# Patient Record
Sex: Female | Born: 1965 | Hispanic: No | Marital: Single | Smoking: Current every day smoker
Health system: Southern US, Community
[De-identification: ages and names within clinical notes are randomized; demographics above are authoritative.]

## PROBLEM LIST (undated history)

## (undated) DIAGNOSIS — N390 Urinary tract infection, site not specified: Secondary | ICD-10-CM

## (undated) DIAGNOSIS — R51 Headache: Secondary | ICD-10-CM

## (undated) DIAGNOSIS — T7840XA Allergy, unspecified, initial encounter: Secondary | ICD-10-CM

## (undated) DIAGNOSIS — F32A Depression, unspecified: Secondary | ICD-10-CM

## (undated) DIAGNOSIS — R519 Headache, unspecified: Secondary | ICD-10-CM

## (undated) DIAGNOSIS — F329 Major depressive disorder, single episode, unspecified: Secondary | ICD-10-CM

## (undated) DIAGNOSIS — G43909 Migraine, unspecified, not intractable, without status migrainosus: Secondary | ICD-10-CM

## (undated) HISTORY — DX: Migraine, unspecified, not intractable, without status migrainosus: G43.909

## (undated) HISTORY — DX: Allergy, unspecified, initial encounter: T78.40XA

## (undated) HISTORY — PX: COLON SURGERY: SHX602

## (undated) HISTORY — DX: Headache, unspecified: R51.9

## (undated) HISTORY — DX: Headache: R51

---

## 2015-05-08 ENCOUNTER — Encounter (HOSPITAL_COMMUNITY): Payer: Self-pay | Admitting: *Deleted

## 2015-05-08 ENCOUNTER — Emergency Department (INDEPENDENT_AMBULATORY_CARE_PROVIDER_SITE_OTHER)
Admission: EM | Admit: 2015-05-08 | Discharge: 2015-05-08 | Disposition: A | Discharge status: Home / Self Care | Payer: Self-pay | Source: Home / Self Care | Attending: Emergency Medicine | Admitting: Emergency Medicine

## 2015-05-08 ENCOUNTER — Other Ambulatory Visit (HOSPITAL_COMMUNITY)
Admission: RE | Admit: 2015-05-08 | Discharge: 2015-05-08 | Disposition: A | Discharge status: Home / Self Care | Payer: Self-pay | Source: Ambulatory Visit | Attending: Emergency Medicine | Admitting: Emergency Medicine

## 2015-05-08 DIAGNOSIS — N390 Urinary tract infection, site not specified: Secondary | ICD-10-CM | POA: Insufficient documentation

## 2015-05-08 HISTORY — DX: Major depressive disorder, single episode, unspecified: F32.9

## 2015-05-08 HISTORY — DX: Urinary tract infection, site not specified: N39.0

## 2015-05-08 HISTORY — DX: Depression, unspecified: F32.A

## 2015-05-08 LAB — POCT URINALYSIS DIP (DEVICE)
Bilirubin Urine: NEGATIVE
Glucose, UA: 250 mg/dL — AB
Ketones, ur: NEGATIVE mg/dL
Nitrite: POSITIVE — AB
Protein, ur: 30 mg/dL — AB
UROBILINOGEN UA: 2 mg/dL — AB (ref 0.0–1.0)
pH: 5 (ref 5.0–8.0)

## 2015-05-08 MED ORDER — PHENAZOPYRIDINE HCL 200 MG PO TABS: 200.0000 mg | ORAL_TABLET | Freq: Three times a day (TID) | ORAL | Status: DC | PRN

## 2015-05-08 MED ORDER — FLUCONAZOLE 150 MG PO TABS: ORAL_TABLET | ORAL | Status: DC

## 2015-05-08 MED ORDER — NITROFURANTOIN MONOHYD MACRO 100 MG PO CAPS: 100.0000 mg | ORAL_CAPSULE | Freq: Two times a day (BID) | ORAL | Status: DC

## 2015-05-08 NOTE — ED Provider Notes (Signed)
HPI  SUBJECTIVE:  Natalie Drake is a 49 y.o. female who presents with pt with dysuria, urinary urgency, frequency starting yesterday. She reports lower abdominal pain/pressure after urinating.  No aggravating or alleviating factors.  Tried decreasing fluids with improvement.  No nausea, vomiting, fever, chills, back pain.  No hematuria.  No anorexia, other abdominal pain.  No vaginal bleeding or discharge/odor, vulvar itching, genital rash.  Pt denies being sexually active- last contact "years " ago.  STDs are not a concern today. Patient has not taken antipyretic in the past 4-6 hours.  Pt  is not pregnant. Patient was recently treated for a dental infection with amoxicillin. He has a history of frequent UTIs and yeast infections. She states that this does not feel like a yeast infection but like one of her UTIs. Remote history of nephrolithiasis which passed spontaneously. No history of diabetes, immunosuppression. No h/o STD's, BV.  States this feels similar to previous UTIs. Pt is a smoker.    Past Medical History  Diagnosis Date  . UTI (urinary tract infection)   . Depression     History reviewed. No pertinent past surgical history.  No family history on file.  Social History  Substance Use Topics  . Smoking status: Current Every Day Smoker  . Smokeless tobacco: None     Comment: smokes 2 cigarettes per day; trying to quit  . Alcohol Use: No    No current facility-administered medications for this encounter.  Current outpatient prescriptions:  .  sertraline (ZOLOFT) 100 MG tablet, Take 100 mg by mouth daily., Disp: , Rfl:  .  fluconazole (DIFLUCAN) 150 MG tablet, 1 tab po x 1. May repeat in 72 hours if no improvement, Disp: 2 tablet, Rfl: 0 .  nitrofurantoin, macrocrystal-monohydrate, (MACROBID) 100 MG capsule, Take 1 capsule (100 mg total) by mouth 2 (two) times daily. X 5 days, Disp: 10 capsule, Rfl: 0 .  phenazopyridine (PYRIDIUM) 200 MG tablet, Take 1 tablet (200 mg total) by  mouth 3 (three) times daily as needed for pain., Disp: 6 tablet, Rfl: 0  No Known Allergies   ROS  As noted in HPI.   Physical Exam  BP 109/69 mmHg  Pulse 82  Temp(Src) 97.9 F (36.6 C) (Oral)  Resp 16  SpO2 100%  Constitutional: Well developed, well nourished, no acute distress Eyes:  EOMI, conjunctiva normal bilaterally HENT: Normocephalic, atraumatic,mucus membranes moist Respiratory: Normal inspiratory effort Cardiovascular: Normal rate GI: nondistended soft. Normal appearance. Positive suprapubic tenderness. No flank tenderness. Back: No CVA tenderness GU: Deferred skin: No rash, skin intact Musculoskeletal: no deformities Neurologic: Alert & oriented x 3, no focal neuro deficits Psychiatric: Speech and behavior appropriate   ED Course   Medications - No data to display  No orders of the defined types were placed in this encounter.    No results found for this or any previous visit (from the past 24 hour(s)). No results found.  ED Clinical Impression  UTI (lower urinary tract infection)   ED Assessment/Plan  No previous urine cultures available. Reviewed labs. Glucoseuria, large esterase, nitrites, some ketones, some protein. UA consistent with UTI.  No evidence of pyelonephritis or other intra-abdominal process. Doubt GU cause of her symptoms as patient is asymptomatic and denies being sexually active. Deferring pelvic today. Home with Macrobid, Pyridium. Urine culture to confirm antibiotic choice. Patient to give us a working phone number so that we can change antibiotics if necessary. Follow-up with PMD of choice will give primary care referral sheet.  Discussed labs, medical decision-making, signs and symptoms that prompt return to the emergency department. Patient agrees with plan  Discussed labs, imaging, MDM, plan and followup with patient. Discussed sn/sx that should prompt return to the UC or ED. Patient agrees with plan.  *This clinic note was  created using Dragon dictation software. Therefore, there may be occasional mistakes despite careful proofreading.  ?   Domenick Gong, MD 05/08/15 1754

## 2015-05-08 NOTE — ED Notes (Signed)
C/O dysuria, polyuria since yesterday.  Has hx UTIs.  Recently finished amoxicillin for dental work performed.  Has been taking AZO.

## 2015-05-08 NOTE — Discharge Instructions (Signed)
This practice is taking new patients. They will see you even if you do not have insurance.  [facility] [address] [place], [place]  [address] [phone]  Go to www.goodrx.com to look up your medications. This will give you a list of where you can find your prescriptions at the most affordable prices.   If you have no primary doctor, here are some resources that may be helpful:  Medicaid-accepting [facility] Providers: - [facility]- [address]  [phone];   - [facility]- [address] [place], [address] [phone]  - [facility]- [address] [phone] [facility]- [address]  [phone]  - Renaye Rakers- [address] [phone]  Only accepts [place] patients       after they have her name applied to their card  -Dr. Jackie Plum, [facility]. [address]    [place], [place] [address]  [phone]  Self Pay (no insurance) in [place]: - Sickle Cell Patients: Dr Willey Blade, [facility] [address] [place] [phone]  - [facility] [phone]  - Physician Referral Service- [phone]  - [facility]- [address], [place], [phone];  Monday to Friday, 9 a.m. - 7 p.m.; Saturday 9 a.m. to 1 p.m.  [facility]- [address] [place], [place] [phone]  - [facility]- [address]      [phone] - [facility]- [address] [phone]  [facility], [address] [place]; [phone]; or [address] [place]; [phone].   [facility] [place], [place]. [address], [place]; [phone]; Monday to Wednesday, 8:30 a.m. - 5 p.m.; Thursday, 8:30 a.m. - 8 p.m.  [facility], [address], [place];  [phone]; Monday to Friday, 8 a.m. - 4:30 p.m.   [facility], [place] [place] [address] [place], [phone]; first and third Saturday of the month, 9:30 a.m. - 12:30 p.m.  [facility], [address], [place], [phone]; second Saturday of the month, 9 a.m. -noon.  [facility] for children. For information, call [phone]; [phone]; or [phone].  Other agencies that provide inexpensive medical care:     [facility]  [phone]    [facility]  [phone]    [facility]  [phone] [address] [place] [address]    [facility]  [phone]    [facility]  [phone], [phone]; or [phone].  Chronic Pain Problems Contact [facility]  [phone] Patients need to be referred by their primary care doctor.  [facility]  [facility]     [employer]                          [facility]. [address] [place]                       [address]      [address] [place] [address]   [phone] (After Hours)  General Information: Finding a doctor when you do not have health insurance can be tricky. Although you are  not limited by an insurance plan, you are of course limited by her finances and how much but he can pay out of pocket.  What are your options if you don't have health insurance?   1) Find a Librarian, academicDoctor and Pay Out of Pocket Although you won't have to find out who is covered by your insurance plan, it is a good idea to ask around and get recommendations. You will then need to call the office and see if the doctor you have chosen will accept you as a new patient and what types of options they offer for patients who are self-pay. Some doctors offer discounts or will set up payment plans for their patients who do not have insurance, but you will need to ask so you aren't surprised when you get to your appointment.  2) Contact Your  Local Health Department Not all health departments have doctors that can see patients for sick visits, but many do, so it is worth a call to see if yours does. If you don't know where your local health department is, you can check in your phone book. The CDC also has a tool to help you locate your state's health department, and many state websites also have listings of all of their local health departments.  3) Find a Walk-in Clinic If your illness is not likely to be very severe or complicated, you may want to try a walk in clinic. These are popping up all over the country in pharmacies, drugstores, and shopping centers. They're usually staffed by nurse practitioners or physician assistants that have been trained to treat common illnesses and complaints. They're usually fairly quick and inexpensive. However, if you have serious medical issues or chronic medical problems, these are probably not your best option

## 2015-05-10 LAB — URINE CULTURE: Culture: >=100000

## 2015-05-12 NOTE — ED Notes (Signed)
Call from patient , c/o continued UTI symptoms, asking for refill of antibiotic. Discussed w Dr Griffin BasilJD Kindl, who authorize 5 days macrobid 100 mg po, BID. [facility] [address], at patient request. Left detailed message on store VM

## 2017-07-19 ENCOUNTER — Emergency Department (HOSPITAL_BASED_OUTPATIENT_CLINIC_OR_DEPARTMENT_OTHER)
Admission: EM | Admit: 2017-07-19 | Discharge: 2017-07-19 | Disposition: A | Discharge status: Home / Self Care | Payer: 59 | Attending: Emergency Medicine | Admitting: Emergency Medicine

## 2017-07-19 ENCOUNTER — Other Ambulatory Visit: Payer: Self-pay

## 2017-07-19 ENCOUNTER — Encounter (HOSPITAL_BASED_OUTPATIENT_CLINIC_OR_DEPARTMENT_OTHER): Payer: Self-pay | Admitting: Emergency Medicine

## 2017-07-19 ENCOUNTER — Emergency Department (HOSPITAL_BASED_OUTPATIENT_CLINIC_OR_DEPARTMENT_OTHER): Payer: 59

## 2017-07-19 DIAGNOSIS — Z79899 Other long term (current) drug therapy: Secondary | ICD-10-CM | POA: Insufficient documentation

## 2017-07-19 DIAGNOSIS — Y998 Other external cause status: Secondary | ICD-10-CM | POA: Insufficient documentation

## 2017-07-19 DIAGNOSIS — S29019A Strain of muscle and tendon of unspecified wall of thorax, initial encounter: Secondary | ICD-10-CM | POA: Insufficient documentation

## 2017-07-19 DIAGNOSIS — W01198A Fall on same level from slipping, tripping and stumbling with subsequent striking against other object, initial encounter: Secondary | ICD-10-CM | POA: Insufficient documentation

## 2017-07-19 DIAGNOSIS — Y929 Unspecified place or not applicable: Secondary | ICD-10-CM | POA: Diagnosis not present

## 2017-07-19 DIAGNOSIS — Y9389 Activity, other specified: Secondary | ICD-10-CM | POA: Diagnosis not present

## 2017-07-19 DIAGNOSIS — W19XXXA Unspecified fall, initial encounter: Secondary | ICD-10-CM

## 2017-07-19 DIAGNOSIS — S299XXA Unspecified injury of thorax, initial encounter: Secondary | ICD-10-CM | POA: Diagnosis present

## 2017-07-19 DIAGNOSIS — F17210 Nicotine dependence, cigarettes, uncomplicated: Secondary | ICD-10-CM | POA: Insufficient documentation

## 2017-07-19 DIAGNOSIS — S39012A Strain of muscle, fascia and tendon of lower back, initial encounter: Secondary | ICD-10-CM

## 2017-07-19 DIAGNOSIS — F329 Major depressive disorder, single episode, unspecified: Secondary | ICD-10-CM | POA: Insufficient documentation

## 2017-07-19 MED ORDER — CYCLOBENZAPRINE HCL 10 MG PO TABS: 10.0000 mg | ORAL_TABLET | Freq: Three times a day (TID) | ORAL | 0 refills | Status: DC | PRN

## 2017-07-19 MED ORDER — KETOROLAC TROMETHAMINE 15 MG/ML IJ SOLN
15.0000 mg | Freq: Once | INTRAMUSCULAR | Status: AC
  Administered 2017-07-19: 15 mg via INTRAVENOUS
  Filled 2017-07-19: qty 1

## 2017-07-19 MED ORDER — FENTANYL CITRATE (PF) 100 MCG/2ML IJ SOLN
100.0000 ug | Freq: Once | INTRAMUSCULAR | Status: DC
  Filled 2017-07-19: qty 2

## 2017-07-19 MED ORDER — FENTANYL CITRATE (PF) 100 MCG/2ML IJ SOLN
100.0000 ug | Freq: Once | INTRAMUSCULAR | Status: AC
  Administered 2017-07-19: 100 ug via INTRAVENOUS

## 2017-07-19 MED ORDER — KETOROLAC TROMETHAMINE 60 MG/2ML IM SOLN: 60.0000 mg | Freq: Once | INTRAMUSCULAR | Status: DC

## 2017-07-19 NOTE — ED Notes (Signed)
No changes. Updated on wait, plan & process with rationale. Lying flat supine, NAD, calm, resps e/u, no dyspnea.

## 2017-07-19 NOTE — ED Notes (Addendum)
EDP back into room

## 2017-07-19 NOTE — ED Notes (Signed)
Patient transported to X-ray 

## 2017-07-19 NOTE — ED Notes (Addendum)
Alert, NAD, calm, interactive, resps e/u, speaking in clear complete sentences, no dyspnea noted, skin W&D, VSS, slipped and fell, c/o pain across mid upper back below shoulder blades, also tailbone, "feel better now than previously s/p percocet PTA, lying flat supine at this time, "hurts to breathe", describes as sob, (denies: LOC, head or neck pain, NV, numbness, tingling, bleeding, dizziness or visual changes). MAEx4. LS CTA. [facility]

## 2017-07-19 NOTE — ED Notes (Signed)
Lying flat, supine, NAD, calm, interactive, no dyspnea, denies nausea, pain improved, updated, VSS.

## 2017-07-19 NOTE — ED Notes (Signed)
EDP at BS 

## 2017-07-19 NOTE — ED Triage Notes (Signed)
Pt fell down 2 steps today. C/o upper back pain and pelvic pain. Took 2 oxycodone 1 hour PTA.

## 2017-07-19 NOTE — ED Notes (Signed)
EDP into room, immediately called out of room d/t phone call.

## 2017-07-19 NOTE — ED Provider Notes (Signed)
[facility] EMERGENCY DEPARTMENT Provider Note   CSN: [id number] Arrival date & time: 07/19/17  7846     History   Chief Complaint Chief Complaint  Patient presents with  . Fall    HPI Natalie Drake is a 52 y.o. female.  HPI  52 year old female presents with back pain after a fall.  She states that she slipped going down her steps and landed on a step on her thoracic back as well as landing on the ground with her buttocks.  She is having some pain in her buttocks but that has gotten much better and currently has pain in her thoracic back.  It is diffuse across her back.  At first the wind was knocked out of her but now she only has pain in her back when breathing.  She denies any shortness of breath.  She did not hit her head, lose consciousness.  She denies any chest pain, abdominal pain.  She was unable to move due to the pain but has not noticed any weakness or numbness in her extremities.  The buttocks is feeling much better after she took OxyContin at home.  Past Medical History:  Diagnosis Date  . Depression   . UTI (urinary tract infection)     There are no active problems to display for this patient.   Past Surgical History:  Procedure Laterality Date  . COLON SURGERY      OB History    No data available       Home Medications    Prior to Admission medications   Medication Sig Start Date End Date Taking? Authorizing Provider  cyclobenzaprine (FLEXERIL) 10 MG tablet Take 1 tablet (10 mg total) by mouth 3 (three) times daily as needed for muscle spasms. 07/19/17   Pricilla Loveless, MD  fluconazole (DIFLUCAN) 150 MG tablet 1 tab po x 1. May repeat in 72 hours if no improvement 05/08/15   Domenick Gong, MD  nitrofurantoin, macrocrystal-monohydrate, (MACROBID) 100 MG capsule Take 1 capsule (100 mg total) by mouth 2 (two) times daily. X 5 days 05/08/15   Domenick Gong, MD  phenazopyridine (PYRIDIUM) 200 MG tablet Take 1 tablet (200 mg total) by mouth 3  (three) times daily as needed for pain. 05/08/15   Domenick Gong, MD  sertraline (ZOLOFT) 100 MG tablet Take 100 mg by mouth daily.    [provider]    Family History No family history on file.  Social History Social History   Tobacco Use  . Smoking status: Current Every Day Smoker  . Smokeless tobacco: Never Used  . Tobacco comment: smokes 2 cigarettes per day; trying to quit  Substance Use Topics  . Alcohol use: No  . Drug use: No     Allergies   Codeine   Review of Systems Review of Systems  Respiratory: Negative for shortness of breath.   Cardiovascular: Negative for chest pain.  Musculoskeletal: Positive for back pain.  Neurological: Negative for weakness and numbness.  All other systems reviewed and are negative.    Physical Exam Updated Vital Signs BP 106/60   Pulse 79   Temp 97.7 F (36.5 C) (Oral)   Resp 16   SpO2 95%   Physical Exam  Constitutional: She is oriented to person, place, and time. She appears well-developed and well-nourished.  HENT:  Head: Normocephalic and atraumatic.  Right Ear: External ear normal.  Left Ear: External ear normal.  Nose: Nose normal.  Eyes: Right eye exhibits no discharge. Left eye  exhibits no discharge.  Cardiovascular: Normal rate, regular rhythm and normal heart sounds.  Pulmonary/Chest: Effort normal and breath sounds normal.  Abdominal: Soft. She exhibits no distension. There is no tenderness.  Musculoskeletal:       Cervical back: She exhibits no tenderness.       Thoracic back: She exhibits no tenderness.       Lumbar back: She exhibits no tenderness.  I am unable to reproduce patient's back pain in mid-thoracic back, states it is "inside"  Neurological: She is alert and oriented to person, place, and time.  5/5 strength in BLE  Skin: Skin is warm and dry.  Nursing note and vitals reviewed.    ED Treatments / Results  Labs (all labs ordered are listed, but only abnormal results are  displayed) Labs Reviewed - No data to display  EKG  EKG Interpretation None       Radiology Dg Chest 1 View  Result Date: 07/19/2017 CLINICAL DATA:  52 y/o F; fell down 2 steps, mid back pain, worse with deep inhalation. EXAM: THORACIC SPINE - 3 VIEWS; CHEST  1 VIEW COMPARISON:  None. FINDINGS: Thoracic spine: There is no evidence of thoracic spine fracture. Alignment is normal. No other significant bone abnormalities are identified. Chest one view: Normal cardiac silhouette. Clear lungs. No pleural effusion or pneumothorax. No acute osseous abnormality is evident. IMPRESSION: 1. No acute fracture or dislocation identified. 2. No acute pulmonary process on single-view chest radiograph. Electronically Signed   By: Mitzi HansenLance  Furusawa-Stratton M.D.   On: 07/19/2017 22:13   Dg Thoracic Spine W/swimmers  Result Date: 07/19/2017 CLINICAL DATA:  52 y/o F; fell down 2 steps, mid back pain, worse with deep inhalation. EXAM: THORACIC SPINE - 3 VIEWS; CHEST  1 VIEW COMPARISON:  None. FINDINGS: Thoracic spine: There is no evidence of thoracic spine fracture. Alignment is normal. No other significant bone abnormalities are identified. Chest one view: Normal cardiac silhouette. Clear lungs. No pleural effusion or pneumothorax. No acute osseous abnormality is evident. IMPRESSION: 1. No acute fracture or dislocation identified. 2. No acute pulmonary process on single-view chest radiograph. Electronically Signed   By: Mitzi HansenLance  Furusawa-Stratton M.D.   On: 07/19/2017 22:13    Procedures Procedures (including critical care time)  Medications Ordered in ED Medications  ketorolac (TORADOL) 15 MG/ML injection 15 mg (15 mg Intravenous Given 07/19/17 2136)  fentaNYL (SUBLIMAZE) injection 100 mcg (100 mcg Intravenous Given 07/19/17 2142)     Initial Impression / Assessment and Plan / ED Course  I have reviewed the triage vital signs and the nursing notes.  Pertinent labs & imaging results that were available during my  care of the patient were reviewed by me and considered in my medical decision making (see chart for details).     Patient is neurologically intact.  While she does have pain, she does not have any bony injury seen.  Highly doubt acute ligamentous injury or neuro emergency.  Otherwise exam is benign.  She will be encouraged to use NSAIDs, Tylenol, and will be given muscle relaxers.  Otherwise, follow-up with PCP.  Return precautions.  Final Clinical Impressions(s) / ED Diagnoses   Final diagnoses:  Fall, initial encounter  Back strain, initial encounter    ED Discharge Orders        Ordered    cyclobenzaprine (FLEXERIL) 10 MG tablet  3 times daily PRN     07/19/17 2256       Pricilla LovelessGoldston, Terryl Molinelli, MD 07/19/17 2312

## 2017-11-05 ENCOUNTER — Ambulatory Visit: Payer: 59 | Admitting: Family Medicine

## 2017-11-05 ENCOUNTER — Encounter: Payer: Self-pay | Admitting: Family Medicine

## 2017-11-05 VITALS — BP 100/70 | HR 82 | Temp 98.6°F | Ht 63.0 in | Wt 128.8 lb

## 2017-11-05 DIAGNOSIS — F339 Major depressive disorder, recurrent, unspecified: Secondary | ICD-10-CM

## 2017-11-05 DIAGNOSIS — Z0000 Encounter for general adult medical examination without abnormal findings: Secondary | ICD-10-CM | POA: Insufficient documentation

## 2017-11-05 DIAGNOSIS — Z0001 Encounter for general adult medical examination with abnormal findings: Secondary | ICD-10-CM | POA: Diagnosis not present

## 2017-11-05 MED ORDER — SERTRALINE HCL 100 MG PO TABS
100.0000 mg | ORAL_TABLET | Freq: Every day | ORAL | 3 refills | Status: DC
Start: 2017-11-05 — End: 2018-11-23

## 2017-11-05 NOTE — Progress Notes (Addendum)
Subjective:  Patient ID: Natalie Drake, female    DOB: 01-04-1966  Age: 52 y.o. MRN: [id number]  CC: Establish Care (est care/ refill on zoloft/migrains/stress and panic attack--2 times a mo)   HPI Natalie Drake presents for a physical exam and follow-up of her depression and anxiety.  Patient's depression symptoms have been controlled with the Zoloft.  This is been a long-term indication for her.  Her life remains stressful between work and home.  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  Work is stressful because so much seems to depend on her.  She fell a few months ago and landed on her upper back.  She was seen at urgent care and no fractures were seen but her upper back remains tender especially with a deep breath.  It is improving and has responded to ibuprofen.  She is due for a mammogram and Pap smear.  Outpatient Medications Prior to Visit  Medication Sig Dispense Refill  . Aspirin-Acetaminophen-Caffeine (EXCEDRIN PO) Take by mouth.    . diphenhydrAMINE HCl (BENADRYL ALLERGY PO) Take by mouth.    . sertraline (ZOLOFT) 100 MG tablet Take 100 mg by mouth daily.    . cyclobenzaprine (FLEXERIL) 10 MG tablet Take 1 tablet (10 mg total) by mouth 3 (three) times daily as needed for muscle spasms. (Patient not taking: Reported on 11/05/2017) 20 tablet 0  . fluconazole (DIFLUCAN) 150 MG tablet 1 tab po x 1. May repeat in 72 hours if no improvement (Patient not taking: Reported on 11/05/2017) 2 tablet 0  . nitrofurantoin, macrocrystal-monohydrate, (MACROBID) 100 MG capsule Take 1 capsule (100 mg total) by mouth 2 (two) times daily. X 5 days (Patient not taking: Reported on 11/05/2017) 10 capsule 0  . phenazopyridine (PYRIDIUM) 200 MG tablet Take 1 tablet (200 mg total) by mouth 3 (three) times daily as needed for pain. (Patient not taking: Reported on 11/05/2017) 6 tablet 0   No facility-administered medications prior to visit.     ROS Review of Systems    Constitutional: Negative for chills, fatigue, fever and unexpected weight change.  HENT: Negative.   Eyes: Negative.   Respiratory: Negative.   Cardiovascular: Negative.   Gastrointestinal: Negative.   Endocrine: Negative for polyphagia and polyuria.  Genitourinary: Negative for difficulty urinating and hematuria.  Musculoskeletal: Negative for arthralgias and myalgias.  Skin: Negative for pallor and rash.  Allergic/Immunologic: Negative for immunocompromised state.  Neurological: Negative for weakness and headaches.  Hematological: Does not bruise/bleed easily.  Psychiatric/Behavioral: Positive for dysphoric mood. Negative for behavioral problems and confusion. The patient is nervous/anxious.     Objective:  BP 100/70   Pulse 82   Temp 98.6 F (37 C) (Oral)   Ht 5\' 3"  (1.6 m)   Wt 128 lb 12.8 oz (58.4 kg)   SpO2 96%   BMI 22.82 kg/m   BP Readings from Last 3 Encounters:  11/05/17 100/70  07/19/17 106/60  05/08/15 109/69    Wt Readings from Last 3 Encounters:  11/05/17 128 lb 12.8 oz (58.4 kg)    Physical Exam  Constitutional: She is oriented to person, place, and time. She appears well-developed and well-nourished. No distress.  HENT:  Head: Normocephalic and atraumatic.  Right Ear: External ear normal.  Left Ear: External ear normal.  Mouth/Throat: Oropharynx is clear and moist. No oropharyngeal exudate.  Eyes: Pupils are equal, round, and reactive to light. Conjunctivae and EOM are normal. Right eye exhibits no discharge. Left  eye exhibits no discharge. No scleral icterus.  Neck: Normal range of motion. Neck supple. No JVD present. No tracheal deviation present. No thyromegaly present.  Cardiovascular: Normal rate, regular rhythm and normal heart sounds.  Pulmonary/Chest: Effort normal and breath sounds normal.  Abdominal: Soft. Bowel sounds are normal.  Musculoskeletal: She exhibits no edema or deformity.  Lymphadenopathy:    She has no cervical adenopathy.   Neurological: She is alert and oriented to person, place, and time.  Skin: Skin is warm and dry. Capillary refill takes less than 2 seconds. She is not diaphoretic.  Psychiatric: She has a normal mood and affect. Her behavior is normal.   [facility] 11/05/2017  Decreased Interest 1  Down, Depressed, Hopeless 2  PHQ - 2 Score 3  Altered sleeping 1  Tired, decreased energy 3  Change in appetite 0  Feeling bad or failure about yourself  1  Trouble concentrating 2  Moving slowly or fidgety/restless 0  Suicidal thoughts 0  PHQ-9 Score 10    No results found for: WBC, HGB, HCT, PLT, GLUCOSE, CHOL, TRIG, HDL, LDLDIRECT, LDLCALC, ALT, AST, NA, K, CL, CREATININE, BUN, CO2, TSH, PSA, INR, GLUF, HGBA1C, MICROALBUR  Dg Chest 1 View  Result Date: 07/19/2017 CLINICAL DATA:  52 y/o F; fell down 2 steps, mid back pain, worse with deep inhalation. EXAM: THORACIC SPINE - 3 VIEWS; CHEST  1 VIEW COMPARISON:  None. FINDINGS: Thoracic spine: There is no evidence of thoracic spine fracture. Alignment is normal. No other significant bone abnormalities are identified. Chest one view: Normal cardiac silhouette. Clear lungs. No pleural effusion or pneumothorax. No acute osseous abnormality is evident. IMPRESSION: 1. No acute fracture or dislocation identified. 2. No acute pulmonary process on single-view chest radiograph. Electronically Signed   By: Mitzi HansenLance  Furusawa-Stratton M.D.   On: 07/19/2017 22:13   Dg Thoracic Spine W/swimmers  Result Date: 07/19/2017 CLINICAL DATA:  52 y/o F; fell down 2 steps, mid back pain, worse with deep inhalation. EXAM: THORACIC SPINE - 3 VIEWS; CHEST  1 VIEW COMPARISON:  None. FINDINGS: Thoracic spine: There is no evidence of thoracic spine fracture. Alignment is normal. No other significant bone abnormalities are identified. Chest one view: Normal cardiac silhouette. Clear lungs. No pleural effusion or pneumothorax. No acute osseous abnormality is evident. IMPRESSION: 1.  No acute fracture or dislocation identified. 2. No acute pulmonary process on single-view chest radiograph. Electronically Signed   By: Mitzi HansenLance  Furusawa-Stratton M.D.   On: 07/19/2017 22:13    Assessment & Plan:   Natalie Drake was seen today for establish care.  Diagnoses and all orders for this visit:  Encounter for health maintenance examination with abnormal findings -     CBC; Future -     Comprehensive metabolic panel; Future -     Lipid panel; Future -     Urinalysis, Routine w reflex microscopic; Future -     HIV antibody; Future  Depression, recurrent (HCC) -     sertraline (ZOLOFT) 100 MG tablet; Take 1 tablet (100 mg total) by mouth daily.   I have changed Natalie Drake's sertraline. I am also having her maintain her phenazopyridine, nitrofurantoin (macrocrystal-monohydrate), fluconazole, cyclobenzaprine, diphenhydrAMINE HCl (BENADRYL ALLERGY PO), and Aspirin-Acetaminophen-Caffeine (EXCEDRIN PO).  Meds ordered this encounter  Medications  . sertraline (ZOLOFT) 100 MG tablet    Sig: Take 1 tablet (100 mg total) by mouth daily.    Dispense:  100 tablet    Refill:  3   She will return  fasting for blood work.  She will schedule her mammogram and follow-up at some point for Pap smear.  We discussed the possibility of pursuing counseling.  She will let me know if this is something she would like to pursue.  Follow-up: No follow-ups on file.  Mliss Sax, MD

## 2017-11-07 ENCOUNTER — Encounter: Payer: Self-pay | Admitting: Family Medicine

## 2018-09-09 ENCOUNTER — Telehealth: Payer: Self-pay | Admitting: Family Medicine

## 2018-09-09 NOTE — Telephone Encounter (Signed)
Called pt on behalf of Dr Doreene Burke because pt was last seen 11/05/17 and orders were put in for her to do fasting labs but she never scheduled. I called to see if she would still like to schedule, left message.

## 2018-11-19 ENCOUNTER — Ambulatory Visit: Payer: 59 | Admitting: Family Medicine

## 2018-11-23 ENCOUNTER — Encounter: Payer: Self-pay | Admitting: Family Medicine

## 2018-11-23 ENCOUNTER — Ambulatory Visit: Payer: 59 | Admitting: Family Medicine

## 2018-11-23 VITALS — BP 120/70 | HR 102 | Ht 63.0 in | Wt 136.2 lb

## 2018-11-23 DIAGNOSIS — Z720 Tobacco use: Secondary | ICD-10-CM | POA: Diagnosis not present

## 2018-11-23 DIAGNOSIS — Z0000 Encounter for general adult medical examination without abnormal findings: Secondary | ICD-10-CM

## 2018-11-23 DIAGNOSIS — F339 Major depressive disorder, recurrent, unspecified: Secondary | ICD-10-CM

## 2018-11-23 MED ORDER — SERTRALINE HCL 100 MG PO TABS: 100.0000 mg | ORAL_TABLET | Freq: Every day | ORAL | 3 refills | Status: DC

## 2018-11-23 NOTE — Progress Notes (Signed)
Established Patient Office Visit  Subjective:  Patient ID: Natalie Drake, female    DOB: 06-02-1965  Age: 53 y.o. MRN: [id number]  CC:  Chief Complaint  Patient presents with  . Follow-up    HPI Natalie Drake presents for routine follow-up of her depression.  Is done well over the years with Zoloft.  Has occasional anxiety.  Continues to work at [employer].  She is busy at work and walks a great deal.  Has no regular exercise.  Has been sheltering at home.  Smokes about 10 cigarettes a day.  She does not drink alcohol or use illicit drugs.  Normal Pap smear this past year.  Past Medical History:  Diagnosis Date  . Allergy   . Depression   . Frequent headaches   . Migraines   . UTI (urinary tract infection)     Past Surgical History:  Procedure Laterality Date  . COLON SURGERY      Family History  Problem Relation Age of Onset  . Hearing loss Mother   . Hypertension Mother     Social History   Socioeconomic History  . Marital status: Single    Spouse name: Not on file  . Number of children: Not on file  . Years of education: Not on file  . Highest education level: Not on file  Occupational History  . Not on file  Social Needs  . Financial resource strain: Not on file  . Food insecurity    Worry: Not on file    Inability: Not on file  . Transportation needs    Medical: Not on file    Non-medical: Not on file  Tobacco Use  . Smoking status: Current Every Day Smoker    Packs/day: 0.50    Types: Cigarettes  . Smokeless tobacco: Never Used  . Tobacco comment: smokes 2 cigarettes per day; trying to quit  Substance and Sexual Activity  . Alcohol use: No  . Drug use: No  . Sexual activity: Not on file  Lifestyle  . Physical activity    Days per week: Not on file    Minutes per session: Not on file  . Stress: Not on file  Relationships  . Social Herbalist on phone: Not on file    Gets together: Not on file    Attends religious service:  Not on file    Active member of club or organization: Not on file    Attends meetings of clubs or organizations: Not on file    Relationship status: Not on file  . Intimate partner violence    Fear of current or ex partner: Not on file    Emotionally abused: Not on file    Physically abused: Not on file    Forced sexual activity: Not on file  Other Topics Concern  . Not on file  Social History Narrative  . Not on file    Outpatient Medications Prior to Visit  Medication Sig Dispense Refill  . Aspirin-Acetaminophen-Caffeine (EXCEDRIN PO) Take by mouth.    . diphenhydrAMINE HCl (BENADRYL ALLERGY PO) Take by mouth.    . sertraline (ZOLOFT) 100 MG tablet Take 1 tablet (100 mg total) by mouth daily. 100 tablet 3  . cyclobenzaprine (FLEXERIL) 10 MG tablet Take 1 tablet (10 mg total) by mouth 3 (three) times daily as needed for muscle spasms. (Patient not taking: Reported on 11/05/2017) 20 tablet 0  . fluconazole (DIFLUCAN) 150 MG tablet 1 tab po x  1. May repeat in 72 hours if no improvement (Patient not taking: Reported on 11/05/2017) 2 tablet 0  . nitrofurantoin, macrocrystal-monohydrate, (MACROBID) 100 MG capsule Take 1 capsule (100 mg total) by mouth 2 (two) times daily. X 5 days (Patient not taking: Reported on 11/05/2017) 10 capsule 0  . phenazopyridine (PYRIDIUM) 200 MG tablet Take 1 tablet (200 mg total) by mouth 3 (three) times daily as needed for pain. (Patient not taking: Reported on 11/05/2017) 6 tablet 0   No facility-administered medications prior to visit.     Allergies  Allergen Reactions  . Codeine Itching    High dose  . Sulfa Antibiotics Nausea And Vomiting    ROS Review of Systems  Constitutional: Negative for chills, diaphoresis, fatigue, fever and unexpected weight change.  HENT: Negative.   Eyes: Negative for photophobia and visual disturbance.  Respiratory: Negative.   Cardiovascular: Negative.   Endocrine: Negative for polyphagia and polyuria.  Genitourinary:  Negative for dysuria, frequency and urgency.  Musculoskeletal: Negative for arthralgias and myalgias.  Skin: Negative for pallor and rash.  Allergic/Immunologic: Negative for immunocompromised state.  Neurological: Negative for light-headedness and numbness.  Hematological: Negative.    Depression screen [facility] 2/9 11/23/2018 11/05/2017  Decreased Interest 1 1  Down, Depressed, Hopeless 1 2  PHQ - 2 Score 2 3  Altered sleeping 0 1  Tired, decreased energy 0 3  Change in appetite 0 0  Feeling bad or failure about yourself  0 1  Trouble concentrating 1 2  Moving slowly or fidgety/restless 0 0  Suicidal thoughts 0 0  PHQ-9 Score 3 10      Objective:    Physical Exam  Constitutional: She is oriented to person, place, and time. She appears well-developed and well-nourished. No distress.  HENT:  Head: Normocephalic and atraumatic.  Right Ear: External ear normal.  Left Ear: External ear normal.  Mouth/Throat: Oropharynx is clear and moist. No oropharyngeal exudate.  Eyes: Pupils are equal, round, and reactive to light. Conjunctivae are normal. Right eye exhibits no discharge. Left eye exhibits no discharge. No scleral icterus.  Neck: Neck supple. No JVD present. No tracheal deviation present. No thyromegaly present.  Cardiovascular: Normal rate, regular rhythm and normal heart sounds.  Pulmonary/Chest: Effort normal and breath sounds normal. No stridor.  Abdominal: Bowel sounds are normal.  Lymphadenopathy:    She has no cervical adenopathy.  Neurological: She is alert and oriented to person, place, and time.  Skin: Skin is warm and dry. She is not diaphoretic.  Psychiatric: She has a normal mood and affect. Her behavior is normal.    BP 120/70   Pulse (!) 102   Ht 5' 3" (1.6 m)   Wt 136 lb 4 oz (61.8 kg)   SpO2 98%   BMI 24.14 kg/m  Wt Readings from Last 3 Encounters:  11/23/18 136 lb 4 oz (61.8 kg)  11/05/17 128 lb 12.8 oz (58.4 kg)   BP Readings from Last 3 Encounters:   11/23/18 120/70  11/05/17 100/70  07/19/17 106/60   Guideline developer:  UpToDate (see UpToDate for funding source) Date Released: June 2014  Health Maintenance Due  Topic Date Due  . Hepatitis C Screening  1966/05/03  . HIV Screening  08/01/1978  . TETANUS/TDAP  08/01/1982    There are no preventive care reminders to display for this patient.  No results found for: TSH No results found for: WBC, HGB, HCT, MCV, PLT No results found for: NA, K, CHLORIDE, CO2, GLUCOSE, BUN,  CREATININE, BILITOT, ALKPHOS, AST, ALT, PROT, ALBUMIN, CALCIUM, ANIONGAP, EGFR, GFR No results found for: CHOL No results found for: HDL No results found for: LDLCALC No results found for: TRIG No results found for: CHOLHDL No results found for: HGBA1C    Assessment & Plan:   Problem List Items Addressed This Visit      Other   Healthcare maintenance   Relevant Orders   CBC   Comprehensive metabolic panel   Lipid panel   TSH   Urinalysis, Routine w reflex microscopic   Depression, recurrent (HCC) - Primary   Relevant Medications   sertraline (ZOLOFT) 100 MG tablet   Tobacco use      Meds ordered this encounter  Medications  . sertraline (ZOLOFT) 100 MG tablet    Sig: Take 1 tablet (100 mg total) by mouth daily.    Dispense:  100 tablet    Refill:  3    Follow-up: Return in about 6 months (around 05/26/2019).    Patient was given information on quitting smoking and advised to do so.  She was also given information on living with depression.  She will follow-up at her convenience for above ordered blood work.

## 2018-11-23 NOTE — Patient Instructions (Signed)
Health Risks of Smoking Smoking cigarettes is very bad for your health. Tobacco smoke has over 200 known poisons in it. It contains the poisonous gases nitrogen oxide and carbon monoxide. There are over 60 chemicals in tobacco smoke that cause cancer. Smoking is difficult to quit because a chemical in tobacco, called nicotine, causes addiction or dependence. When you smoke and inhale, nicotine is absorbed rapidly into the bloodstream through your lungs. Both inhaled and non-inhaled nicotine may be addictive. What are the risks of cigarette smoke? Cigarette smokers have an increased risk of many serious medical problems, including:  Lung cancer.  Lung disease, such as pneumonia, bronchitis, and emphysema.  Chest pain (angina) and heart attack because the heart is not getting enough oxygen.  Heart disease and peripheral blood vessel disease.  High blood pressure (hypertension).  Stroke.  Oral cancer, including cancer of the lip, mouth, or voice box.  Bladder cancer.  Pancreatic cancer.  Cervical cancer.  Pregnancy complications, including premature birth.  Stillbirths and smaller newborn babies, birth defects, and genetic damage to sperm.  Early menopause.  Lower estrogen level for women.  Infertility.  Facial wrinkles.  Blindness.  Increased risk of broken bones (fractures).  Senile dementia.  Stomach ulcers and internal bleeding.  Delayed wound healing and increased risk of complications during surgery.  Even smoking lightly shortens your life expectancy by several years. Because of secondhand smoke exposure, children of smokers have an increased risk of the following:  Sudden infant death syndrome (SIDS).  Respiratory infections.  Lung cancer.  Heart disease.  Ear infections. What are the benefits of quitting? There are many health benefits of quitting smoking. Here are some of them:  Within days of quitting smoking, your risk of having a heart attack  decreases, your blood flow improves, and your lung capacity improves. Blood pressure, pulse rate, and breathing patterns start returning to normal soon after quitting.  Within months, your lungs may clear up completely.  Quitting for 10 years reduces your risk of developing lung cancer and heart disease to almost that of a nonsmoker.  People who quit may see an improvement in their overall quality of life. How do I quit smoking?     Smoking is an addiction with both physical and psychological effects, and longtime habits can be hard to change. Your health care provider can recommend:  Programs and community resources, which may include group support, education, or talk therapy.  Prescription medicines to help reduce cravings.  Nicotine replacement products, such as patches, gum, and nasal sprays. Use these products only as directed. Do not replace cigarette smoking with electronic cigarettes, which are commonly called e-cigarettes. The safety of e-cigarettes is not known, and some may contain harmful chemicals.  A combination of two or more of these methods. Where to find more information  American Lung Association: www.lung.org  American Cancer Society: www.cancer.org Summary  Smoking cigarettes is very bad for your health. Cigarette smokers have an increased risk of many serious medical problems, including several cancers, heart disease, and stroke.  Smoking is an addiction with both physical and psychological effects, and longtime habits can be hard to change.  By stopping right away, you can greatly reduce the risk of medical problems for you and your family.  To help you quit smoking, your health care provider can recommend programs, community resources, prescription medicines, and nicotine replacement products such as patches, gum, and nasal sprays. This information is not intended to replace advice given to you by your health  care provider. Make sure you discuss any questions  you have with your health care provider. Document Released: 06/06/2004 Document Revised: 07/31/2017 Document Reviewed: 05/03/2016 Elsevier Patient Education  2020 [employer] Inc[employer]  Living With Depression Everyone experiences occasional disappointment, sadness, and loss in their lives. When you are feeling down, blue, or sad for at least 2 weeks in a row, it may mean that you have depression. Depression can affect your thoughts and feelings, relationships, daily activities, and physical health. It is caused by changes in the way your brain functions. If you receive a diagnosis of depression, your health care provider will tell you which type of depression you have and what treatment options are available to you. If you are living with depression, there are ways to help you recover from it and also ways to prevent it from coming back. How to cope with lifestyle changes Coping with stress     Stress is your body's reaction to life changes and events, both good and bad. Stressful situations may include:  Getting married.  The death of a spouse.  Losing a job.  Retiring.  Having a baby. Stress can last just a few hours or it can be ongoing. Stress can play a major role in depression, so it is important to learn both how to cope with stress and how to think about it differently. Talk with your health care provider or a counselor if you would like to learn more about stress reduction. He or she may suggest some stress reduction techniques, such as:  Music therapy. This can include creating music or listening to music. Choose music that you enjoy and that inspires you.  Mindfulness-based meditation. This kind of meditation can be done while sitting or walking. It involves being aware of your normal breaths, rather than trying to control your breathing.  Centering prayer. This is a kind of meditation that involves focusing on a spiritual word or phrase. Choose a word, phrase, or sacred image that  is meaningful to you and that brings you peace.  Deep breathing. To do this, expand your stomach and inhale slowly through your nose. Hold your breath for 3-5 seconds, then exhale slowly, allowing your stomach muscles to relax.  Muscle relaxation. This involves intentionally tensing muscles then relaxing them. Choose a stress reduction technique that fits your lifestyle and personality. Stress reduction techniques take time and practice to develop. Set aside 5-15 minutes a day to do them. Therapists can offer training in these techniques. The training may be covered by some insurance plans. Other things you can do to manage stress include:  Keeping a stress diary. This can help you learn what triggers your stress and ways to control your response.  Understanding what your limits are and saying no to requests or events that lead to a schedule that is too full.  Thinking about how you respond to certain situations. You may not be able to control everything, but you can control how you react.  Adding humor to your life by watching funny films or TV shows.  Making time for activities that help you relax and not feeling guilty about spending your time this way.  Medicines Your health care provider may suggest certain medicines if he or she feels that they will help improve your condition. Avoid using alcohol and other substances that may prevent your medicines from working properly (may interact). It is also important to:  Talk with your pharmacist or health care provider about all the medicines that  you take, their possible side effects, and what medicines are safe to take together.  Make it your goal to take part in all treatment decisions (shared decision-making). This includes giving input on the side effects of medicines. It is best if shared decision-making with your health care provider is part of your total treatment plan. If your health care provider prescribes a medicine, you may not  notice the full benefits of it for 4-8 weeks. Most people who are treated for depression need to be on medicine for at least 6-12 months after they feel better. If you are taking medicines as part of your treatment, do not stop taking medicines without first talking to your health care provider. You may need to have the medicine slowly decreased (tapered) over time to decrease the risk of harmful side effects. Relationships Your health care provider may suggest family therapy along with individual therapy and drug therapy. While there may not be family problems that are causing you to feel depressed, it is still important to make sure your family learns as much as they can about your mental health. Having your family's support can help make your treatment successful. How to recognize changes in your condition Everyone has a different response to treatment for depression. Recovery from major depression happens when you have not had signs of major depression for two months. This may mean that you will start to:  Have more interest in doing activities.  Feel less hopeless than you did 2 months ago.  Have more energy.  Overeat less often, or have better or improving appetite.  Have better concentration. Your health care provider will work with you to decide the next steps in your recovery. It is also important to recognize when your condition is getting worse. Watch for these signs:  Having fatigue or low energy.  Eating too much or too little.  Sleeping too much or too little.  Feeling restless, agitated, or hopeless.  Having trouble concentrating or making decisions.  Having unexplained physical complaints.  Feeling irritable, angry, or aggressive. Get help as soon as you or your family members notice these symptoms coming back. How to get support and help from others How to talk with friends and family members about your condition  Talking to friends and family members about your  condition can provide you with one way to get support and guidance. Reach out to trusted friends or family members, explain your symptoms to them, and let them know that you are working with a health care provider to treat your depression. Financial resources Not all insurance plans cover mental health care, so it is important to check with your insurance carrier. If paying for co-pays or counseling services is a problem, search for a local or county mental health care center. They may be able to offer public mental health care services at low or no cost when you are not able to see a private health care provider. If you are taking medicine for depression, you may be able to get the generic form, which may be less expensive. Some makers of prescription medicines also offer help to patients who cannot afford the medicines they need. Follow these instructions at home:   Get the right amount and quality of sleep.  Cut down on using caffeine, tobacco, alcohol, and other potentially harmful substances.  Try to exercise, such as walking or lifting small weights.  Take over-the-counter and prescription medicines only as told by your health care provider.  Eat a  healthy diet that includes plenty of vegetables, fruits, whole grains, low-fat dairy products, and lean protein. Do not eat a lot of foods that are high in solid fats, added sugars, or salt.  Keep all follow-up visits as told by your health care provider. This is important. Contact a health care provider if:  You stop taking your antidepressant medicines, and you have any of these symptoms: ? Nausea. ? Headache. ? Feeling lightheaded. ? Chills and body aches. ? Not being able to sleep (insomnia).  You or your friends and family think your depression is getting worse. Get help right away if:  You have thoughts of hurting yourself or others. If you ever feel like you may hurt yourself or others, or have thoughts about taking your own  life, get help right away. You can go to your nearest emergency department or call:  Your local emergency services (911 in the [place]  A suicide crisis helpline, such as the National Suicide Prevention Lifeline at 312-594-79251-941-143-9303. This is open 24-hours a day. Summary  If you are living with depression, there are ways to help you recover from it and also ways to prevent it from coming back.  Work with your health care team to create a management plan that includes counseling, stress management techniques, and healthy lifestyle habits. This information is not intended to replace advice given to you by your health care provider. Make sure you discuss any questions you have with your health care provider. Document Released: 04/01/2016 Document Revised: 08/21/2018 Document Reviewed: 04/01/2016 Elsevier Patient Education  2020 [employer]

## 2019-07-27 IMAGING — CR DG CHEST 1V
1 series · 1 of 1 positions shown · non-contrast
Comparison: None.

CLINICAL DATA: 51 y/o F; fell down 2 steps, mid back pain, worse
with deep inhalation.

EXAM:
THORACIC SPINE - 3 VIEWS; CHEST  1 VIEW

[t chest supine]
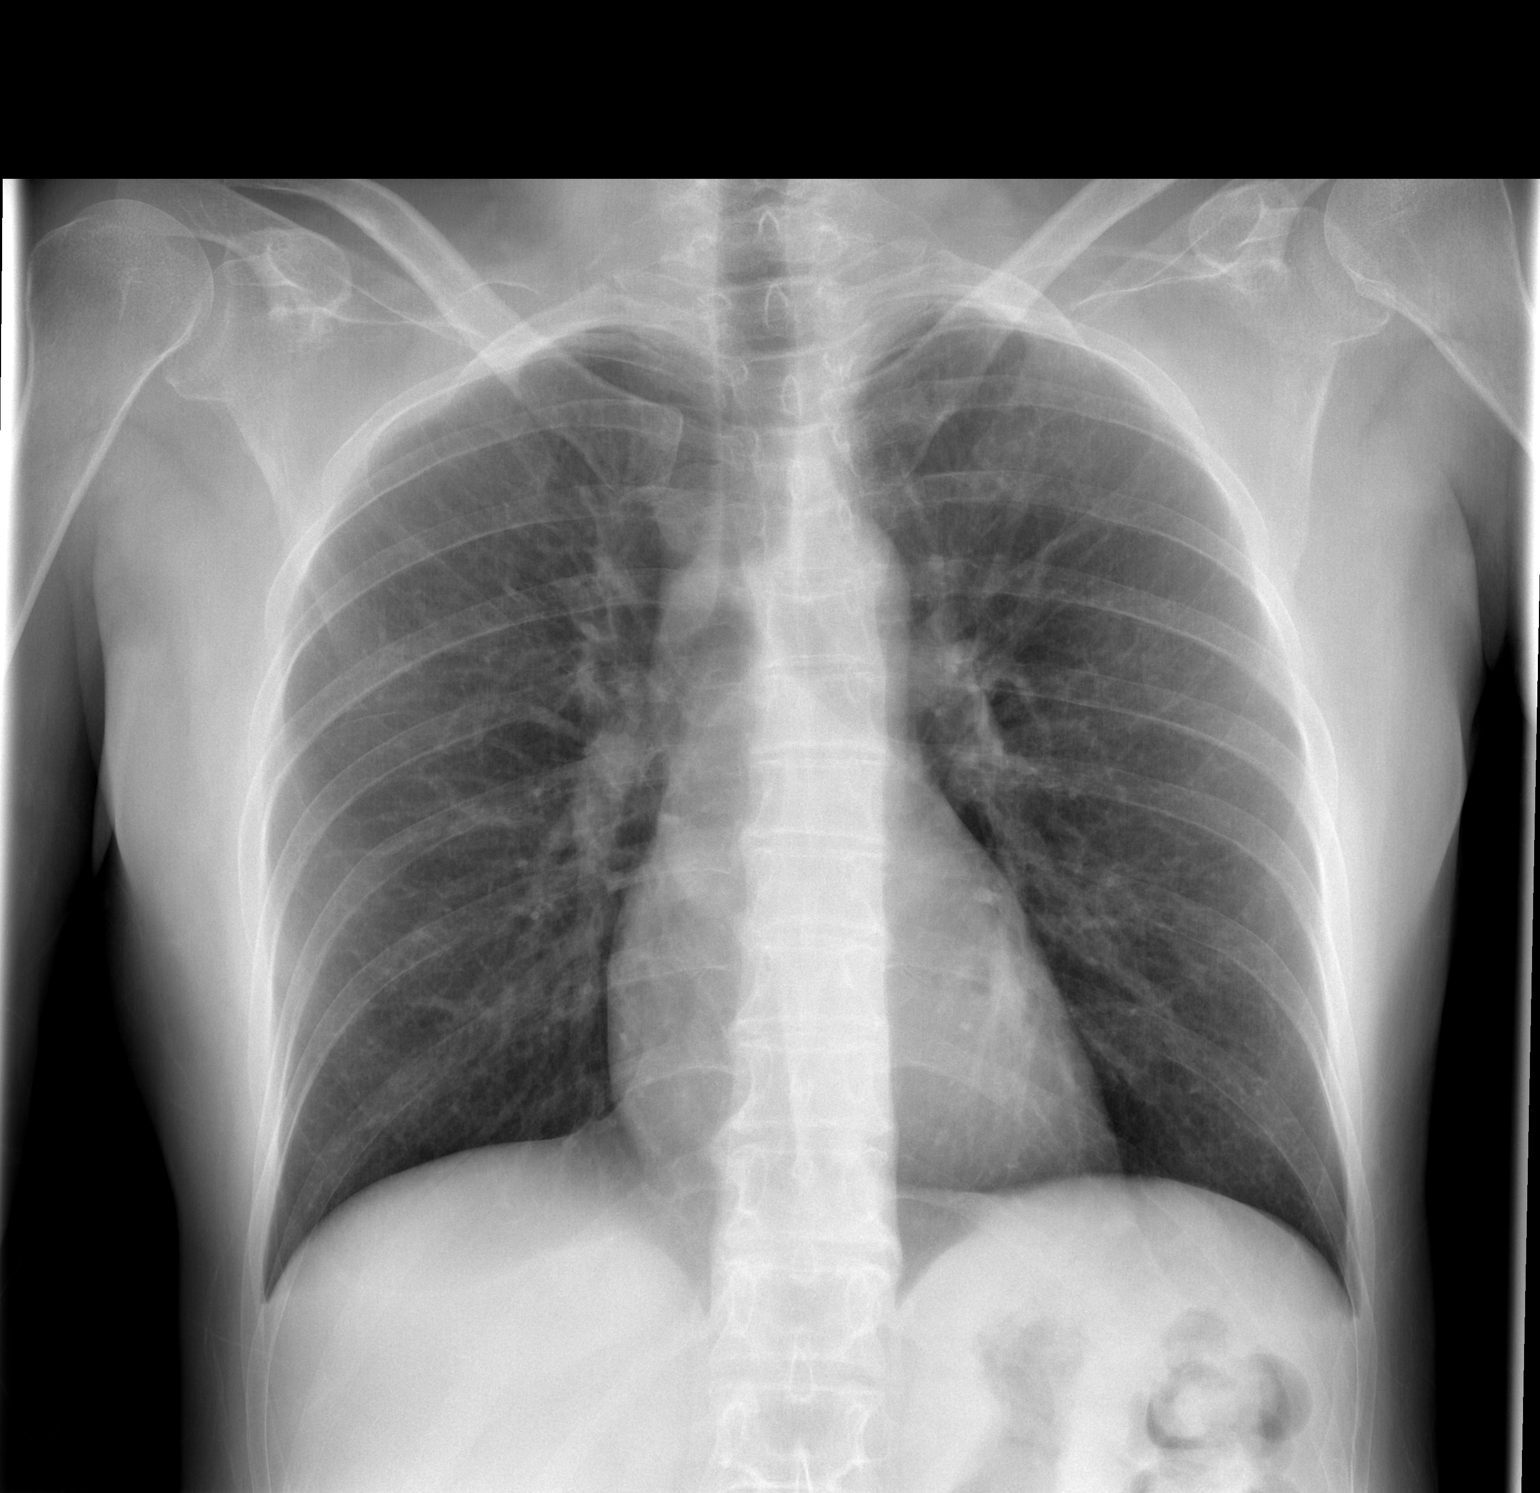

[1 of 1 positions shown; findings below may reference images not displayed]

FINDINGS: Thoracic spine:

There is no evidence of thoracic spine fracture. Alignment is
normal. No other significant bone abnormalities are identified.

Chest one view:

Normal cardiac silhouette. Clear lungs. No pleural effusion or
pneumothorax. No acute osseous abnormality is evident.
IMPRESSION: 1. No acute fracture or dislocation identified.
2. No acute pulmonary process on single-view chest radiograph.

By: Yeli Naula M.D.

## 2019-07-27 IMAGING — CR DG THORACIC SPINE 3V
3 series · 3 of 3 positions shown · non-contrast
Comparison: None.

CLINICAL DATA: 51 y/o F; fell down 2 steps, mid back pain, worse
with deep inhalation.

EXAM:
THORACIC SPINE - 3 VIEWS; CHEST  1 VIEW

[t t-spine a.p.]
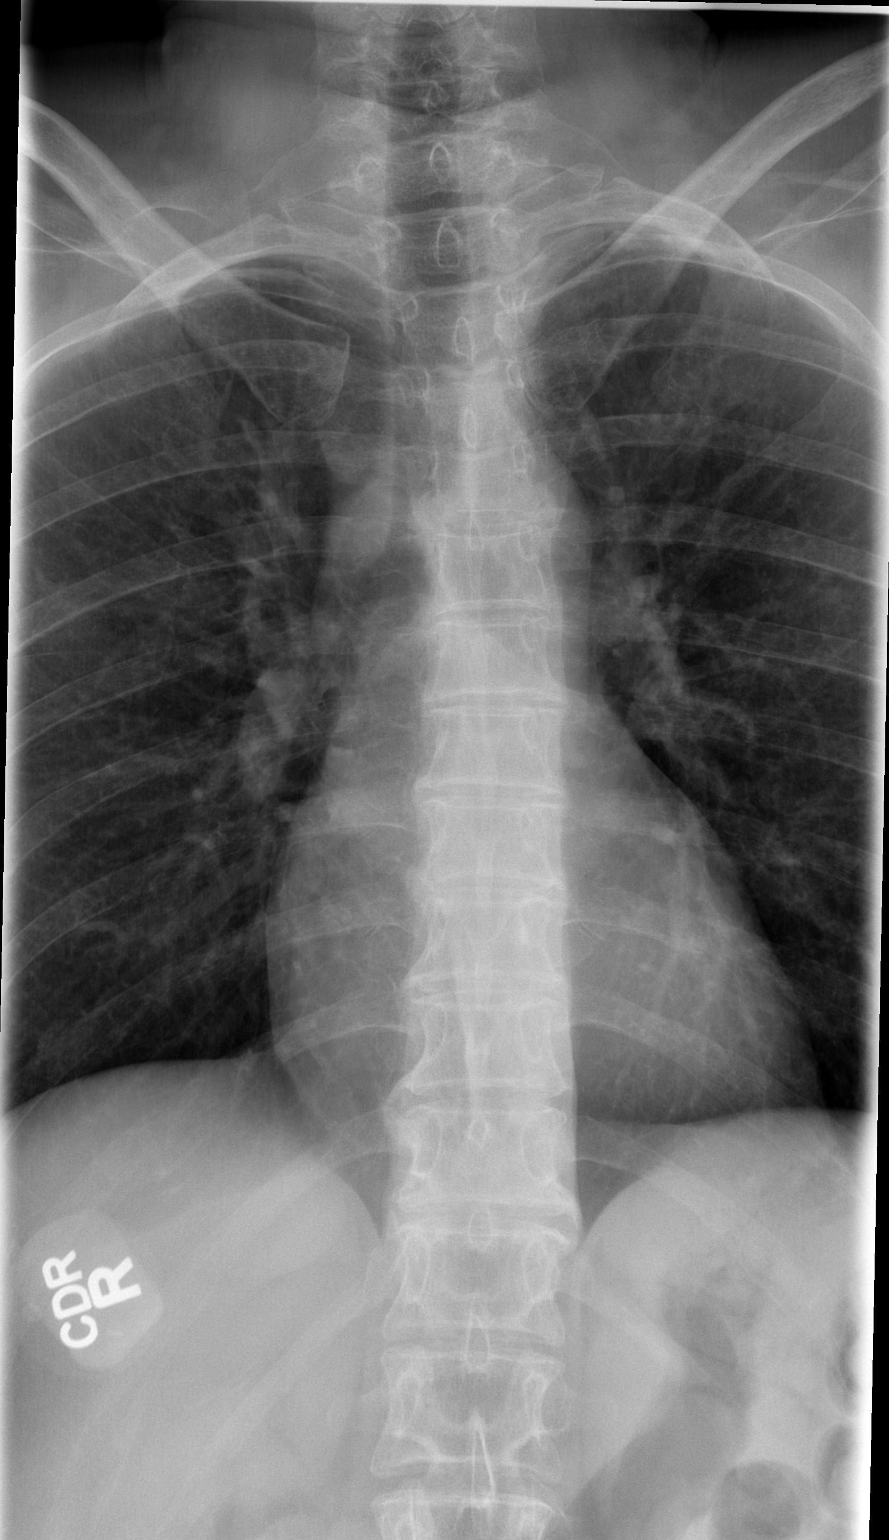

[t t-spine lat *]
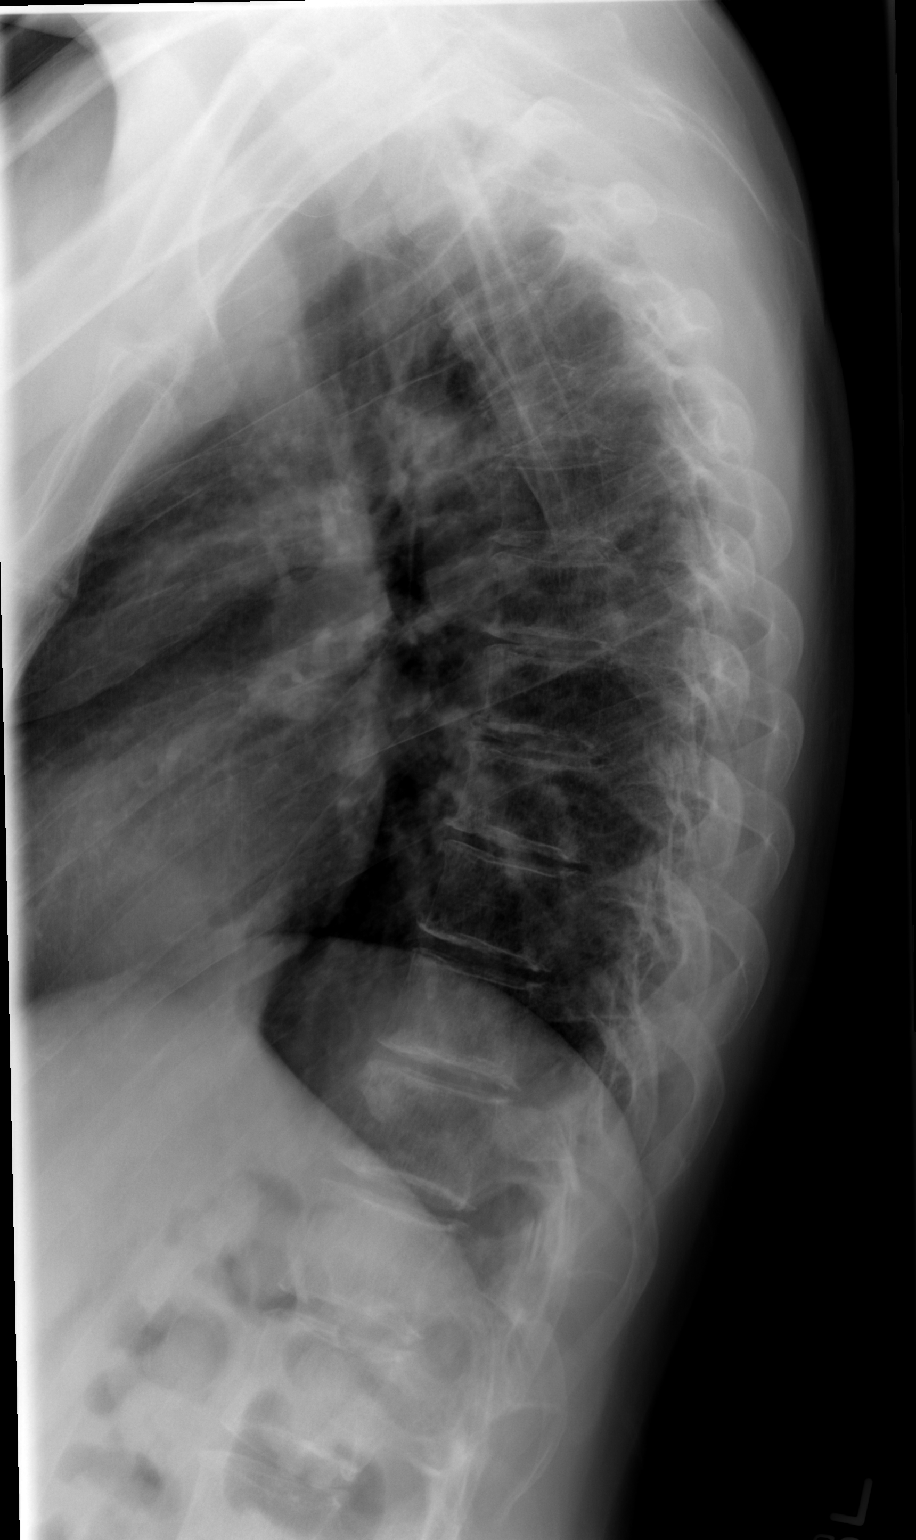

[t swimmers]
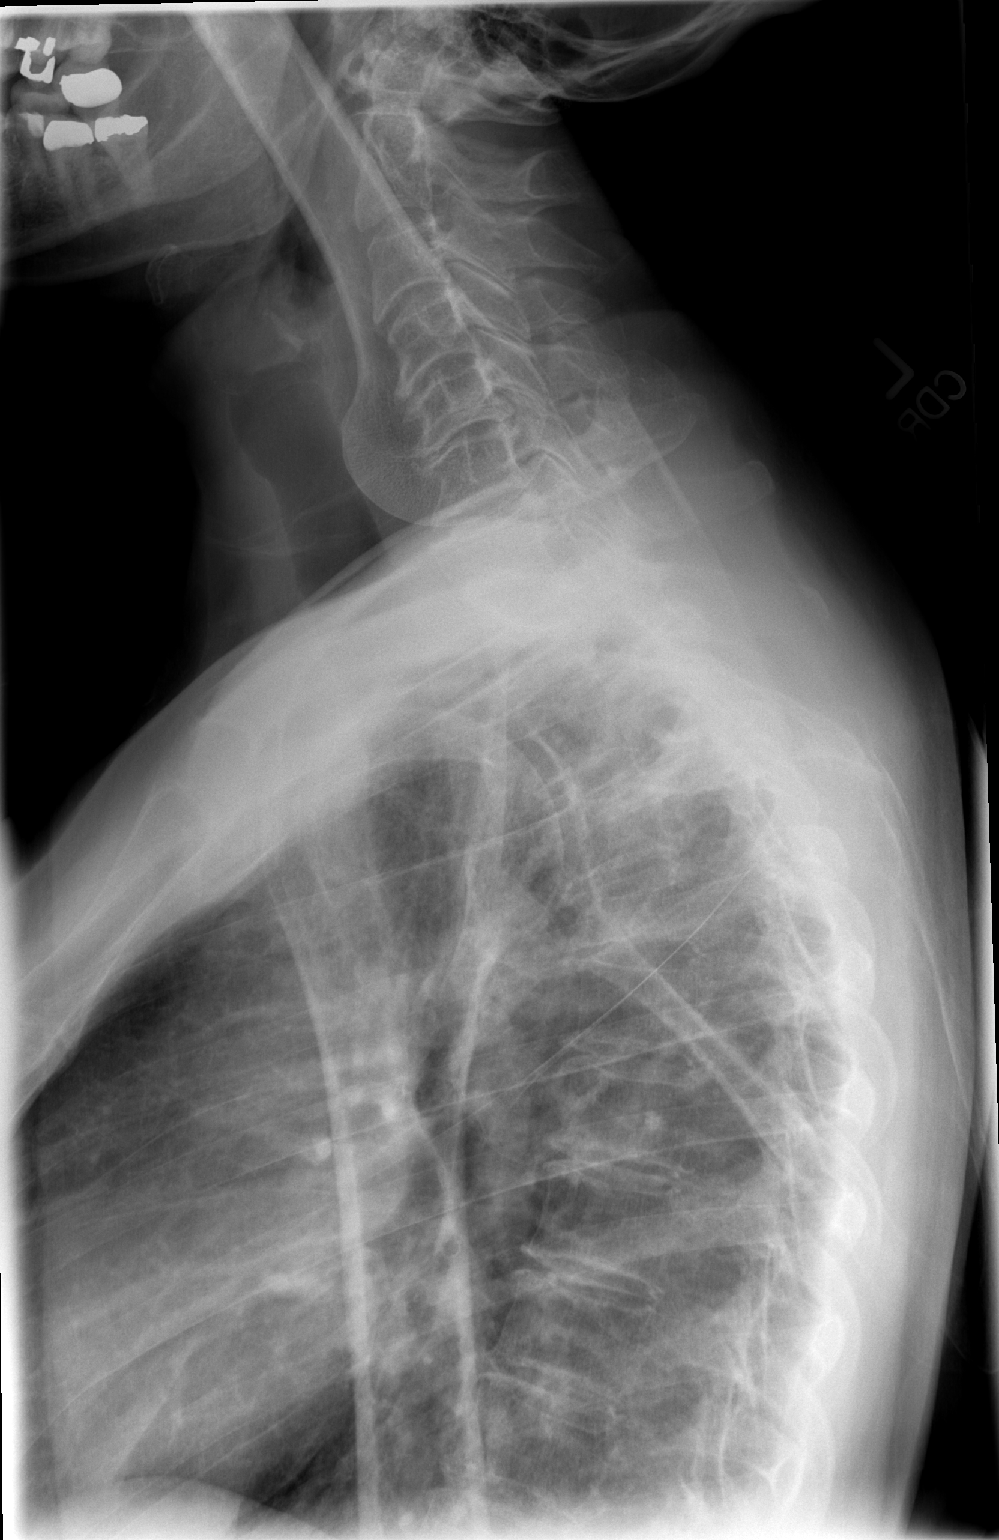

[3 of 3 positions shown; findings below may reference images not displayed]

FINDINGS: Thoracic spine:

There is no evidence of thoracic spine fracture. Alignment is
normal. No other significant bone abnormalities are identified.

Chest one view:

Normal cardiac silhouette. Clear lungs. No pleural effusion or
pneumothorax. No acute osseous abnormality is evident.
IMPRESSION: 1. No acute fracture or dislocation identified.
2. No acute pulmonary process on single-view chest radiograph.

By: Yeli Naula M.D.

## 2020-01-03 ENCOUNTER — Telehealth: Payer: Self-pay | Admitting: Family Medicine

## 2020-01-03 ENCOUNTER — Other Ambulatory Visit: Payer: Self-pay | Admitting: Family Medicine

## 2020-01-03 DIAGNOSIS — F339 Major depressive disorder, recurrent, unspecified: Secondary | ICD-10-CM

## 2020-01-03 NOTE — Telephone Encounter (Signed)
Patient aware that Rx was sent in and agrees to keep upcoming appointment on 8/26

## 2020-01-03 NOTE — Telephone Encounter (Signed)
Patient states that she has 10 pills left on her partial refill for Sertraline, but prescription has expired and she is completely out. Offered sooner appointment, but she was unable to come in due to work schedule (has appointment on Thursday). Please call her at [phone] and advise.

## 2020-01-05 ENCOUNTER — Other Ambulatory Visit: Payer: Self-pay

## 2020-01-06 ENCOUNTER — Ambulatory Visit (INDEPENDENT_AMBULATORY_CARE_PROVIDER_SITE_OTHER): Payer: No Typology Code available for payment source | Admitting: Family Medicine

## 2020-01-06 ENCOUNTER — Encounter: Payer: Self-pay | Admitting: Family Medicine

## 2020-01-06 VITALS — BP 102/74 | HR 85 | Temp 98.0°F | Ht 63.0 in | Wt 132.4 lb

## 2020-01-06 DIAGNOSIS — Z720 Tobacco use: Secondary | ICD-10-CM | POA: Diagnosis not present

## 2020-01-06 DIAGNOSIS — F339 Major depressive disorder, recurrent, unspecified: Secondary | ICD-10-CM

## 2020-01-06 MED ORDER — SERTRALINE HCL 100 MG PO TABS: 100.0000 mg | ORAL_TABLET | Freq: Every day | ORAL | 4 refills | Status: DC

## 2020-01-06 NOTE — Progress Notes (Signed)
Established Patient Office Visit  Subjective:  Patient ID: Natalie Drake, female    DOB: July 25, 1965  Age: 54 y.o. MRN: [id number]  CC:  Chief Complaint  Patient presents with  . Follow-up    follow up/refill on medications     HPI Natalie Drake presents for follow-up of depression. Zoloft is definitely helping her but she admits to breakthrough irritability and sadness. 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 Job is stressful. Continues to smoke. Has tried to quit 6 times already. Has regular dental care. Just completed the Covid vaccine series. Normal Pap pelvic and mammograms a year or so ago. Continues to care for her invalid mother.  Past Medical History:  Diagnosis Date  . Allergy   . Depression   . Frequent headaches   . Migraines   . UTI (urinary tract infection)     Past Surgical History:  Procedure Laterality Date  . COLON SURGERY      Family History  Problem Relation Age of Onset  . Hearing loss Mother   . Hypertension Mother     Social History   Socioeconomic History  . Marital status: Single    Spouse name: Not on file  . Number of children: Not on file  . Years of education: Not on file  . Highest education level: Not on file  Occupational History  . Not on file  Tobacco Use  . Smoking status: Current Every Day Smoker    Packs/day: 0.50    Types: Cigarettes  . Smokeless tobacco: Never Used  . Tobacco comment: smokes 2 cigarettes per day; trying to quit  Vaping Use  . Vaping Use: Never used  Substance and Sexual Activity  . Alcohol use: No  . Drug use: No  . Sexual activity: Not on file  Other Topics Concern  . Not on file  Social History Narrative  . Not on file   Social Determinants of Health   Financial Resource Strain:   . Difficulty of Paying Living Expenses: Not on file  Food Insecurity:   . Worried About Charity fundraiser in the Last Year: Not on file  . Ran Out of Food in the Last Year: Not on file    Transportation Needs:   . Lack of Transportation (Medical): Not on file  . Lack of Transportation (Non-Medical): Not on file  Physical Activity:   . Days of Exercise per Week: Not on file  . Minutes of Exercise per Session: Not on file  Stress:   . Feeling of Stress : Not on file  Social Connections:   . Frequency of Communication with Friends and Family: Not on file  . Frequency of Social Gatherings with Friends and Family: Not on file  . Attends Religious Services: Not on file  . Active Member of Clubs or Organizations: Not on file  . Attends Archivist Meetings: Not on file  . Marital Status: Not on file  Intimate Partner Violence:   . Fear of Current or Ex-Partner: Not on file  . Emotionally Abused: Not on file  . Physically Abused: Not on file  . Sexually Abused: Not on file    Outpatient Medications Prior to Visit  Medication Sig Dispense Refill  . diphenhydrAMINE HCl (BENADRYL ALLERGY PO) Take by mouth.    . sertraline (ZOLOFT) 100 MG tablet Take 1 tablet by mouth once daily 30 tablet 1  . Aspirin-Acetaminophen-Caffeine (EXCEDRIN PO) Take by mouth. (Patient not taking: Reported on 01/06/2020)  No facility-administered medications prior to visit.    Allergies  Allergen Reactions  . Codeine Itching    High dose  . Sulfa Antibiotics Nausea And Vomiting    ROS Review of Systems  Constitutional: Negative.   Respiratory: Negative.   Cardiovascular: Negative.   Gastrointestinal: Negative.   Genitourinary: Negative.   Musculoskeletal: Negative for gait problem and joint swelling.   Depression screen [facility] 2/9 01/06/2020 01/06/2020 11/23/2018  Decreased Interest 0 0 1  Down, Depressed, Hopeless 1 0 1  PHQ - 2 Score 1 0 2  Altered sleeping 1 - 0  Tired, decreased energy 1 - 0  Change in appetite 0 - 0  Feeling bad or failure about yourself  0 - 0  Trouble concentrating 2 - 1  Moving slowly or fidgety/restless 0 - 0  Suicidal thoughts 0 - 0  PHQ-9 Score  5 - 3  Difficult doing work/chores Not difficult at all - -      Objective:    Physical Exam Vitals and nursing note reviewed.  Constitutional:      General: She is not in acute distress.    Appearance: Normal appearance. She is normal weight. She is not ill-appearing, toxic-appearing or diaphoretic.  HENT:     Head: Normocephalic and atraumatic.     Right Ear: Tympanic membrane, ear canal and external ear normal.     Left Ear: Tympanic membrane, ear canal and external ear normal.     Mouth/Throat:     Mouth: Mucous membranes are moist.     Pharynx: Oropharynx is clear. No oropharyngeal exudate or posterior oropharyngeal erythema.  Eyes:     General: No scleral icterus.       Right eye: No discharge.        Left eye: No discharge.     Extraocular Movements: Extraocular movements intact.     Conjunctiva/sclera: Conjunctivae normal.     Pupils: Pupils are equal, round, and reactive to light.  Cardiovascular:     Rate and Rhythm: Normal rate and regular rhythm.  Pulmonary:     Effort: Pulmonary effort is normal.     Breath sounds: Normal breath sounds.  Musculoskeletal:     Cervical back: No rigidity or tenderness.     Right lower leg: No edema.     Left lower leg: No edema.  Lymphadenopathy:     Cervical: No cervical adenopathy.  Skin:    General: Skin is warm and dry.  Neurological:     Mental Status: She is alert and oriented to person, place, and time.  Psychiatric:        Mood and Affect: Mood normal.        Behavior: Behavior normal.     BP 102/74   Pulse 85   Temp 98 F (36.7 C) (Tympanic)   Ht '5\' 3"'  (1.6 m)   Wt 132 lb 6.4 oz (60.1 kg)   SpO2 98%   BMI 23.45 kg/m  Wt Readings from Last 3 Encounters:  01/06/20 132 lb 6.4 oz (60.1 kg)  11/23/18 136 lb 4 oz (61.8 kg)  11/05/17 128 lb 12.8 oz (58.4 kg)     Health Maintenance Due  Topic Date Due  . Hepatitis C Screening  Never done  . HIV Screening  Never done  . TETANUS/TDAP  Never done  .  INFLUENZA VACCINE  12/12/2019    There are no preventive care reminders to display for this patient.  No results found for: TSH No results found  for: WBC, HGB, HCT, MCV, PLT No results found for: NA, K, CHLORIDE, CO2, GLUCOSE, BUN, CREATININE, BILITOT, ALKPHOS, AST, ALT, PROT, ALBUMIN, CALCIUM, ANIONGAP, EGFR, GFR No results found for: CHOL No results found for: HDL No results found for: LDLCALC No results found for: TRIG No results found for: CHOLHDL No results found for: HGBA1C    Assessment & Plan:   Problem List Items Addressed This Visit      Other   Depression, recurrent ([place]) - Primary   Relevant Medications   sertraline (ZOLOFT) 100 MG tablet   Tobacco use      Meds ordered this encounter  Medications  . sertraline (ZOLOFT) 100 MG tablet    Sig: Take 1 tablet (100 mg total) by mouth daily.    Dispense:  100 tablet    Refill:  4    Follow-up: Return in about 1 year (around 01/05/2021), or if symptoms worsen or fail to improve.  Patient was given information on mindfulness based stress reduction, exercising to stay healthy and coping with quitting smoking. We discussed augmenting Zoloft with an additional medication and she is not interested in doing that right now. Encouraged her to try to get some exercise. Encouraged her to try to quit smoking again. Mentioned that often times it takes several attempts to quit. Refuses blood work again today. I will keep asking.  Libby Maw, MD

## 2020-01-06 NOTE — Patient Instructions (Addendum)
Coping with Quitting Smoking  Quitting smoking is a physical and mental challenge. You will face cravings, withdrawal symptoms, and temptation. Before quitting, work with your health care provider to make a plan that can help you cope. Preparation can help you quit and keep you from giving in. How can I cope with cravings? Cravings usually last for 5-10 minutes. If you get through it, the craving will pass. Consider taking the following actions to help you cope with cravings:  Keep your mouth busy: ? Chew sugar-free gum. ? Suck on hard candies or a straw. ? Brush your teeth.  Keep your hands and body busy: ? Immediately change to a different activity when you feel a craving. ? Squeeze or play with a ball. ? Do an activity or a hobby, like making bead jewelry, practicing needlepoint, or working with wood. ? Mix up your normal routine. ? Take a short exercise break. Go for a quick walk or run up and down stairs. ? Spend time in public places where smoking is not allowed.  Focus on doing something kind or helpful for someone else.  Call a friend or family member to talk during a craving.  Join a support group.  Call a quit line, such as 1-800-QUIT-NOW.  Talk with your health care provider about medicines that might help you cope with cravings and make quitting easier for you. How can I deal with withdrawal symptoms? Your body may experience negative effects as it tries to get used to not having nicotine in the system. These effects are called withdrawal symptoms. They may include:  Feeling hungrier than normal.  Trouble concentrating.  Irritability.  Trouble sleeping.  Feeling depressed.  Restlessness and agitation.  Craving a cigarette. To manage withdrawal symptoms:  Avoid places, people, and activities that trigger your cravings.  Remember why you want to quit.  Get plenty of sleep.  Avoid coffee and other caffeinated drinks. These may worsen some of your  symptoms. How can I handle social situations? Social situations can be difficult when you are quitting smoking, especially in the first few weeks. To manage this, you can:  Avoid parties, bars, and other social situations where people might be smoking.  Avoid alcohol.  Leave right away if you have the urge to smoke.  Explain to your family and friends that you are quitting smoking. Ask for understanding and support.  Plan activities with friends or family where smoking is not an option. What are some ways I can cope with stress? Wanting to smoke may cause stress, and stress can make you want to smoke. Find ways to manage your stress. Relaxation techniques can help. For example:  Breathe slowly and deeply, in through your nose and out through your mouth.  Listen to soothing, relaxing music.  Talk with a family member or friend about your stress.  Light a candle.  Soak in a bath or take a shower.  Think about a peaceful place. What are some ways I can prevent weight gain? Be aware that many people gain weight after they quit smoking. However, not everyone does. To keep from gaining weight, have a plan in place before you quit and stick to the plan after you quit. Your plan should include:  Having healthy snacks. When you have a craving, it may help to: ? Eat plain popcorn, crunchy carrots, celery, or other cut vegetables. ? Chew sugar-free gum.  Changing how you eat: ? Eat small portion sizes at meals. ? Eat 4-6 small meals   throughout the day instead of 1-2 large meals a day. ? Be mindful when you eat. Do not watch television or do other things that might distract you as you eat.  Exercising regularly: ? Make time to exercise each day. If you do not have time for a long workout, do short bouts of exercise for 5-10 minutes several times a day. ? Do some form of strengthening exercise, like weight lifting, and some form of aerobic exercise, like running or swimming.  Drinking  plenty of water or other low-calorie or no-calorie drinks. Drink 6-8 glasses of water daily, or as much as instructed by your health care provider. Summary  Quitting smoking is a physical and mental challenge. You will face cravings, withdrawal symptoms, and temptation to smoke again. Preparation can help you as you go through these challenges.  You can cope with cravings by keeping your mouth busy (such as by chewing gum), keeping your body and hands busy, and making calls to family, friends, or a helpline for people who want to quit smoking.  You can cope with withdrawal symptoms by avoiding places where people smoke, avoiding drinks with caffeine, and getting plenty of rest.  Ask your health care provider about the different ways to prevent weight gain, avoid stress, and handle social situations. This information is not intended to replace advice given to you by your health care provider. Make sure you discuss any questions you have with your health care provider. Document Revised: 04/11/2017 Document Reviewed: 04/26/2016 Elsevier Patient Education  2020 [employer]  Exercising to Stay Healthy To become healthy and stay healthy, it is recommended that you do moderate-intensity and vigorous-intensity exercise. You can tell that you are exercising at a moderate intensity if your heart starts beating faster and you start breathing faster but can still hold a conversation. You can tell that you are exercising at a vigorous intensity if you are breathing much harder and faster and cannot hold a conversation while exercising. Exercising regularly is important. It has many health benefits, such as:  Improving overall fitness, flexibility, and endurance.  Increasing bone density.  Helping with weight control.  Decreasing body fat.  Increasing muscle strength.  Reducing stress and tension.  Improving overall health. How often should I exercise? Choose an activity that you enjoy, and set  realistic goals. Your health care provider can help you make an activity plan that works for you. Exercise regularly as told by your health care provider. This may include:  Doing strength training two times a week, such as: ? Lifting weights. ? Using resistance bands. ? Push-ups. ? Sit-ups. ? Yoga.  Doing a certain intensity of exercise for a given amount of time. Choose from these options: ? A total of 150 minutes of moderate-intensity exercise every week. ? A total of 75 minutes of vigorous-intensity exercise every week. ? A mix of moderate-intensity and vigorous-intensity exercise every week. Children, pregnant women, people who have not exercised regularly, people who are overweight, and older adults may need to talk with a health care provider about what activities are safe to do. If you have a medical condition, be sure to talk with your health care provider before you start a new exercise program. What are some exercise ideas? Moderate-intensity exercise ideas include:  Walking 1 mile (1.6 km) in about 15 minutes.  Biking.  Hiking.  Golfing.  Dancing.  Water aerobics. Vigorous-intensity exercise ideas include:  Walking 4.5 miles (7.2 km) or more in about 1 hour.  Jogging  or running 5 miles (8 km) in about 1 hour.  Biking 10 miles (16.1 km) or more in about 1 hour.  Lap swimming.  Roller-skating or in-line skating.  Cross-country skiing.  Vigorous competitive sports, such as football, basketball, and soccer.  Jumping rope.  Aerobic dancing. What are some everyday activities that can help me to get exercise?  Yard work, such as: ? Pushing a Surveyor, mining. ? Raking and bagging leaves.  Washing your car.  Pushing a stroller.  Shoveling snow.  Gardening.  Washing windows or floors. How can I be more active in my day-to-day activities?  Use stairs instead of an elevator.  Take a walk during your lunch break.  If you drive, park your car farther away  from your work or school.  If you take public transportation, get off one stop early and walk the rest of the way.  Stand up or walk around during all of your indoor phone calls.  Get up, stretch, and walk around every 30 minutes throughout the day.  Enjoy exercise with a friend. Support to continue exercising will help you keep a regular routine of activity. What guidelines can I follow while exercising?  Before you start a new exercise program, talk with your health care provider.  Do not exercise so much that you hurt yourself, feel dizzy, or get very short of breath.  Wear comfortable clothes and wear shoes with good support.  Drink plenty of water while you exercise to prevent dehydration or heat stroke.  Work out until your breathing and your heartbeat get faster. Where to find more information  [place] Department of Health and Human Services: ThisPath.fi  Centers for Disease Control and Prevention (CDC): FootballExhibition.com.br Summary  Exercising regularly is important. It will improve your overall fitness, flexibility, and endurance.  Regular exercise also will improve your overall health. It can help you control your weight, reduce stress, and improve your bone density.  Do not exercise so much that you hurt yourself, feel dizzy, or get very short of breath.  Before you start a new exercise program, talk with your health care provider. This information is not intended to replace advice given to you by your health care provider. Make sure you discuss any questions you have with your health care provider. Document Revised: 04/11/2017 Document Reviewed: 03/20/2017 Elsevier Patient Education  2020 Elsevier Inc.  Mindfulness-Based Stress Reduction Mindfulness-based stress reduction (MBSR) is a program that helps people learn to practice mindfulness. Mindfulness is the practice of intentionally paying attention to the present moment. It can be learned and practiced through techniques  such as education, breathing exercises, meditation, and yoga. MBSR includes several mindfulness techniques in one program. MBSR works best when you understand the treatment, are willing to try new things, and can commit to spending time practicing what you learn. MBSR training may include learning about:  How your emotions, thoughts, and reactions affect your body.  New ways to respond to things that cause negative thoughts to start (triggers).  How to notice your thoughts and let go of them.  Practicing awareness of everyday things that you normally do without thinking.  The techniques and goals of different types of meditation. What are the benefits of MBSR? MBSR can have many benefits, which include helping you to:  Develop self-awareness. This refers to knowing and understanding yourself.  Learn skills and attitudes that help you to participate in your own health care.  Learn new ways to care for yourself.  Be more  accepting about how things are, and let things go.  Be less judgmental and approach things with an open mind.  Be patient with yourself and trust yourself more. MBSR has also been shown to:  Reduce negative emotions, such as depression and anxiety.  Improve memory and focus.  Change how you sense and approach pain.  Boost your body's ability to fight infections.  Help you connect better with other people.  Improve your sense of well-being. Follow these instructions at home:   Find a local in-person or online MBSR program.  Set aside some time regularly for mindfulness practice.  Find a mindfulness practice that works best for you. This may include one or more of the following: ? Meditation. Meditation involves focusing your mind on a certain thought or activity. ? Breathing awareness exercises. These help you to stay present by focusing on your breath. ? Body scan. For this practice, you lie down and pay attention to each part of your body from head to  toe. You can identify tension and soreness and intentionally relax parts of your body. ? Yoga. Yoga involves stretching and breathing, and it can improve your ability to move and be flexible. It can also provide an experience of testing your body's limits, which can help you release stress. ? Mindful eating. This way of eating involves focusing on the taste, texture, color, and smell of each bite of food. Because this slows down eating and helps you feel full sooner, it can be an important part of a weight-loss plan.  Find a podcast or recording that provides guidance for breathing awareness, body scan, or meditation exercises. You can listen to these any time when you have a free moment to rest without distractions.  Follow your treatment plan as told by your health care provider. This may include taking regular medicines and making changes to your diet or lifestyle as recommended. How to practice mindfulness To do a basic awareness exercise:  Find a comfortable place to sit.  Pay attention to the present moment. Observe your thoughts, feelings, and surroundings just as they are.  Avoid placing judgment on yourself, your feelings, or your surroundings. Make note of any judgment that comes up, and let it go.  Your mind may wander, and that is okay. Make note of when your thoughts drift, and return your attention to the present moment. To do basic mindfulness meditation:  Find a comfortable place to sit. This may include a stable chair or a firm floor cushion. ? Sit upright with your back straight. Let your arms fall next to your side with your hands resting on your legs. ? If sitting in a chair, rest your feet flat on the floor. ? If sitting on a cushion, cross your legs in front of you.  Keep your head in a neutral position with your chin dropped slightly. Relax your jaw and rest the tip of your tongue on the roof of your mouth. Drop your gaze to the floor. You can close your eyes if you  like.  Breathe normally and pay attention to your breath. Feel the air moving in and out of your nose. Feel your belly expanding and relaxing with each breath.  Your mind may wander, and that is okay. Make note of when your thoughts drift, and return your attention to your breath.  Avoid placing judgment on yourself, your feelings, or your surroundings. Make note of any judgment or feelings that come up, let them go, and bring your attention  back to your breath.  When you are ready, lift your gaze or open your eyes. Pay attention to how your body feels after the meditation. Where to find more information You can find more information about MBSR from:  Your health care provider.  Community-based meditation centers or programs.  Programs offered near you. Summary  Mindfulness-based stress reduction (MBSR) is a program that teaches you how to intentionally pay attention to the present moment. It is used with other treatments to help you cope better with daily stress, emotions, and pain.  MBSR focuses on developing self-awareness, which allows you to respond to life stress without judgment or negative emotions.  MBSR programs may involve learning different mindfulness practices, such as breathing exercises, meditation, yoga, body scan, or mindful eating. Find a mindfulness practice that works best for you, and set aside time for it on a regular basis. This information is not intended to replace advice given to you by your health care provider. Make sure you discuss any questions you have with your health care provider. Document Revised: 04/11/2017 Document Reviewed: 09/05/2016 Elsevier Patient Education  2020 [employer].

## 2021-01-18 ENCOUNTER — Other Ambulatory Visit: Payer: Self-pay | Admitting: Family Medicine

## 2021-01-18 DIAGNOSIS — F339 Major depressive disorder, recurrent, unspecified: Secondary | ICD-10-CM

## 2021-04-16 ENCOUNTER — Ambulatory Visit: Payer: No Typology Code available for payment source | Admitting: Family Medicine

## 2021-04-16 ENCOUNTER — Encounter: Payer: Self-pay | Admitting: Family Medicine

## 2021-04-16 ENCOUNTER — Other Ambulatory Visit: Payer: Self-pay

## 2021-04-16 VITALS — BP 118/68 | HR 92 | Temp 97.1°F | Ht 63.0 in | Wt 132.0 lb

## 2021-04-16 DIAGNOSIS — H6121 Impacted cerumen, right ear: Secondary | ICD-10-CM | POA: Insufficient documentation

## 2021-04-16 DIAGNOSIS — E7800 Pure hypercholesterolemia, unspecified: Secondary | ICD-10-CM

## 2021-04-16 DIAGNOSIS — Z0000 Encounter for general adult medical examination without abnormal findings: Secondary | ICD-10-CM

## 2021-04-16 DIAGNOSIS — F339 Major depressive disorder, recurrent, unspecified: Secondary | ICD-10-CM

## 2021-04-16 DIAGNOSIS — Z720 Tobacco use: Secondary | ICD-10-CM

## 2021-04-16 DIAGNOSIS — R319 Hematuria, unspecified: Secondary | ICD-10-CM

## 2021-04-16 LAB — CBC
HCT: 41.6 % (ref 36.0–46.0)
Hemoglobin: 14.2 g/dL (ref 12.0–15.0)
MCHC: 34.2 g/dL (ref 30.0–36.0)
MCV: 90.5 fl (ref 78.0–100.0)
Platelets: 248 10*3/uL (ref 150.0–400.0)
RBC: 4.6 Mil/uL (ref 3.87–5.11)
RDW: 12.1 % (ref 11.5–15.5)
WBC: 5 10*3/uL (ref 4.0–10.5)

## 2021-04-16 MED ORDER — SERTRALINE HCL 100 MG PO TABS: 100.0000 mg | ORAL_TABLET | Freq: Every day | ORAL | 4 refills | Status: DC

## 2021-04-16 NOTE — Progress Notes (Addendum)
Established Patient Office Visit  Subjective:  Patient ID: Natalie Drake, female    DOB: Sep 26, 1965  Age: 55 y.o. MRN: [id number]  CC:  Chief Complaint  Patient presents with   Medication Refill    Refill/follow up on medications.     HPI Natalie Drake presents for yearly health check and follow-up of depression.  Sertraline helps and she wants to continue it.  Her life is stressful.  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  Continues to smoke half a pack daily.  Is more of a stress relief thing for her.  Last Pap in 2020.   She is not active sexually.  Nonfasting today.  Past Medical History:  Diagnosis Date   Allergy    Depression    Frequent headaches    Migraines    UTI (urinary tract infection)     Past Surgical History:  Procedure Laterality Date   COLON SURGERY      Family History  Problem Relation Age of Onset   Hearing loss Mother    Hypertension Mother     Social History   Socioeconomic History   Marital status: Single    Spouse name: Not on file   Number of children: Not on file   Years of education: Not on file   Highest education level: Not on file  Occupational History   Not on file  Tobacco Use   Smoking status: Every Day    Packs/day: 0.50    Types: Cigarettes   Smokeless tobacco: Never   Tobacco comments:    smokes 2 cigarettes per day; trying to quit  Vaping Use   Vaping Use: Never used  Substance and Sexual Activity   Alcohol use: No   Drug use: No   Sexual activity: Not on file  Other Topics Concern   Not on file  Social History Narrative   Not on file   Social Determinants of Health   Financial Resource Strain: Not on file  Food Insecurity: Not on file  Transportation Needs: Not on file  Physical Activity: Not on file  Stress: Not on file  Social Connections: Not on file  Intimate Partner Violence: Not on file    Outpatient Medications Prior to  Visit  Medication Sig Dispense Refill   diphenhydrAMINE HCl (BENADRYL ALLERGY PO) Take by mouth.     Aspirin-Acetaminophen-Caffeine (EXCEDRIN PO) Take by mouth.     sertraline (ZOLOFT) 100 MG tablet Take 1 tablet by mouth once daily 90 tablet 0   No facility-administered medications prior to visit.    Allergies  Allergen Reactions   Codeine Itching    High dose   Sulfa Antibiotics Nausea And Vomiting    ROS Review of Systems  Constitutional: Negative.   HENT: Negative.    Eyes:  Negative for photophobia and visual disturbance.  Respiratory: Negative.    Cardiovascular: Negative.   Gastrointestinal: Negative.   Genitourinary: Negative.   Musculoskeletal: Negative.   Neurological:  Negative for speech difficulty and weakness.  Psychiatric/Behavioral:  Positive for dysphoric mood. The patient is nervous/anxious.      Depression screen [facility] 2/9 04/16/2021 04/16/2021 04/16/2021  Decreased Interest 1 0 0  Down, Depressed, Hopeless 2 0 0  PHQ - 2 Score 3 0 0  Altered sleeping 0 1 -  Tired, decreased energy 2 1 -  Change in appetite 0 0 -  Feeling bad or failure about yourself  0 0 -  Trouble concentrating 2 0 -  Moving slowly or fidgety/restless 0 0 -  Suicidal thoughts 0 0 -  PHQ-9 Score 7 2 -  Difficult doing work/chores Somewhat difficult Not difficult at all -     Objective:    Physical Exam Vitals and nursing note reviewed.  Constitutional:      General: She is not in acute distress.    Appearance: Normal appearance. She is normal weight. She is not ill-appearing, toxic-appearing or diaphoretic.  HENT:     Head: Normocephalic and atraumatic.     Right Ear: There is impacted cerumen.     Left Ear: Tympanic membrane, ear canal and external ear normal.     Mouth/Throat:     Mouth: Mucous membranes are moist.     Pharynx: Oropharynx is clear. No oropharyngeal exudate or posterior oropharyngeal erythema.  Eyes:     General: No scleral icterus.       Right eye: No  discharge.        Left eye: No discharge.     Extraocular Movements: Extraocular movements intact.     Conjunctiva/sclera: Conjunctivae normal.     Pupils: Pupils are equal, round, and reactive to light.  Neck:     Vascular: No carotid bruit.  Cardiovascular:     Rate and Rhythm: Normal rate and regular rhythm.  Pulmonary:     Effort: Pulmonary effort is normal.     Breath sounds: Normal breath sounds.  Abdominal:     General: Bowel sounds are normal.  Musculoskeletal:     Cervical back: No rigidity or tenderness.     Right lower leg: No edema.     Left lower leg: No edema.  Lymphadenopathy:     Cervical: No cervical adenopathy.  Neurological:     Mental Status: She is alert and oriented to person, place, and time.  Psychiatric:        Mood and Affect: Mood normal.        Behavior: Behavior normal.    BP 118/68 (BP Location: Right Arm, Patient Position: Sitting, Cuff Size: Normal)   Pulse 92   Temp (!) 97.1 F (36.2 C) (Temporal)   Ht 5\' 3"  (1.6 m)   Wt 132 lb (59.9 kg)   SpO2 97%   BMI 23.38 kg/m  Wt Readings from Last 3 Encounters:  04/16/21 132 lb (59.9 kg)  01/06/20 132 lb 6.4 oz (60.1 kg)  11/23/18 136 lb 4 oz (61.8 kg)     Health Maintenance Due  Topic Date Due   Pneumococcal Vaccine 51-4 Years old (1 - PCV) Never done   HIV Screening  Never done   Hepatitis C Screening  Never done   TETANUS/TDAP  Never done   Zoster Vaccines- Shingrix (1 of 2) Never done   MAMMOGRAM  04/01/2020   COVID-19 Vaccine (4 - Booster for Moderna series) 07/29/2020   PAP SMEAR-Modifier  04/03/2021    There are no preventive care reminders to display for this patient.  Lab Results  Component Value Date   TSH 1.44 04/16/2021   Lab Results  Component Value Date   WBC 5.0 04/16/2021   HGB 14.2 04/16/2021   HCT 41.6 04/16/2021   MCV 90.5 04/16/2021   PLT 248.0 04/16/2021   Lab Results  Component Value Date   NA 139 04/16/2021   K 4.1 04/16/2021   CO2 28 04/16/2021    GLUCOSE 93 04/16/2021   BUN 9 04/16/2021   CREATININE 0.69  04/16/2021   BILITOT 0.5 04/16/2021   ALKPHOS 109 04/16/2021   AST 18 04/16/2021   ALT 19 04/16/2021   PROT 7.3 04/16/2021   ALBUMIN 4.7 04/16/2021   CALCIUM 9.7 04/16/2021   GFR 96.14 04/16/2021   No results found for: CHOL No results found for: HDL No results found for: LDLCALC No results found for: TRIG No results found for: CHOLHDL No results found for: HGBA1C    Assessment & Plan:   Problem List Items Addressed This Visit       Nervous and Auditory   Excessive cerumen in right ear canal     Other   Healthcare maintenance   Relevant Orders   CBC (Completed)   Comprehensive metabolic panel (Completed)   LDL cholesterol, direct (Completed)   Urinalysis, Routine w reflex microscopic (Completed)   MM Digital Screening   Depression, recurrent (HCC)   Relevant Medications   sertraline (ZOLOFT) 100 MG tablet   Other Relevant Orders   TSH (Completed)   Tobacco use - Primary   Elevated LDL cholesterol level   Relevant Orders   Lipid panel   Hematuria   Relevant Orders   Ambulatory referral to Urology    Meds ordered this encounter  Medications   sertraline (ZOLOFT) 100 MG tablet    Sig: Take 1 tablet (100 mg total) by mouth daily.    Dispense:  90 tablet    Refill:  4    Follow-up: Return in about 1 year (around 04/16/2022), or if symptoms worsen or fail to improve.  Information was given on health maintenance and disease prevention.  Encouraged follow-up Pap smear.  Ordered follow-up mammogram.  Nonfasting today blood work ordered.  Encouraged her to stop smoking.  Information was given about doing this.  We discussed Chantix and she was given information on that.  She declines flu and Pneumovax today.  She will follow-up with Dr. Laurance Flatten for cerumen gnosis.  Libby Maw, MD

## 2021-04-17 LAB — COMPREHENSIVE METABOLIC PANEL
ALT: 19 U/L (ref 0–35)
AST: 18 U/L (ref 0–37)
Albumin: 4.7 g/dL (ref 3.5–5.2)
Alkaline Phosphatase: 109 U/L (ref 39–117)
BUN: 9 mg/dL (ref 6–23)
CO2: 28 mEq/L (ref 19–32)
Calcium: 9.7 mg/dL (ref 8.4–10.5)
Chloride: 102 mEq/L (ref 96–112)
Creatinine, Ser: 0.69 mg/dL (ref 0.40–1.20)
GFR: 96.14 mL/min (ref >60.00)
Glucose, Bld: 93 mg/dL (ref 70–99)
Potassium: 4.1 mEq/L (ref 3.5–5.1)
Sodium: 139 mEq/L (ref 135–145)
Total Bilirubin: 0.5 mg/dL (ref 0.2–1.2)
Total Protein: 7.3 g/dL (ref 6.0–8.3)

## 2021-04-17 LAB — URINALYSIS, ROUTINE W REFLEX MICROSCOPIC
Bilirubin Urine: NEGATIVE
Ketones, ur: NEGATIVE
Nitrite: NEGATIVE
Specific Gravity, Urine: <=1.005 — AB (ref 1.000–1.030)
Total Protein, Urine: NEGATIVE
Urine Glucose: NEGATIVE
Urobilinogen, UA: 0.2 (ref 0.0–1.0)
pH: 6.5 (ref 5.0–8.0)

## 2021-04-17 LAB — TSH: TSH: 1.44 u[IU]/mL (ref 0.35–5.50)

## 2021-04-17 LAB — LDL CHOLESTEROL, DIRECT: Direct LDL: 220 mg/dL

## 2021-04-26 DIAGNOSIS — R319 Hematuria, unspecified: Secondary | ICD-10-CM | POA: Insufficient documentation

## 2021-04-26 DIAGNOSIS — E7800 Pure hypercholesterolemia, unspecified: Secondary | ICD-10-CM | POA: Insufficient documentation

## 2021-04-26 NOTE — Addendum Note (Signed)
Addended by: Andrez Grime on: 04/26/2021 01:38 PM   Modules accepted: Orders

## 2021-04-26 NOTE — Addendum Note (Signed)
Addended by: Andrez Grime on: 04/26/2021 01:31 PM   Modules accepted: Orders

## 2021-05-02 ENCOUNTER — Telehealth (HOSPITAL_BASED_OUTPATIENT_CLINIC_OR_DEPARTMENT_OTHER): Payer: Self-pay

## 2021-05-03 ENCOUNTER — Other Ambulatory Visit (INDEPENDENT_AMBULATORY_CARE_PROVIDER_SITE_OTHER): Payer: No Typology Code available for payment source

## 2021-05-03 ENCOUNTER — Other Ambulatory Visit: Payer: Self-pay

## 2021-05-03 DIAGNOSIS — E7800 Pure hypercholesterolemia, unspecified: Secondary | ICD-10-CM

## 2021-05-03 LAB — LIPID PANEL
Cholesterol: 258 mg/dL — ABNORMAL HIGH (ref 0–200)
HDL: 59.3 mg/dL (ref >39.00)
LDL Cholesterol: 181 mg/dL — ABNORMAL HIGH (ref 0–99)
NonHDL: 198.94
Total CHOL/HDL Ratio: 4
Triglycerides: 91 mg/dL (ref 0.0–149.0)
VLDL: 18.2 mg/dL (ref 0.0–40.0)

## 2022-07-16 ENCOUNTER — Encounter: Payer: Self-pay | Admitting: Family Medicine

## 2022-07-16 ENCOUNTER — Ambulatory Visit: Payer: 59 | Admitting: Family Medicine

## 2022-07-16 VITALS — BP 108/66 | HR 82 | Temp 98.2°F | Ht 63.0 in | Wt 131.6 lb

## 2022-07-16 DIAGNOSIS — Z720 Tobacco use: Secondary | ICD-10-CM

## 2022-07-16 DIAGNOSIS — Z0000 Encounter for general adult medical examination without abnormal findings: Secondary | ICD-10-CM | POA: Diagnosis not present

## 2022-07-16 DIAGNOSIS — F339 Major depressive disorder, recurrent, unspecified: Secondary | ICD-10-CM | POA: Diagnosis not present

## 2022-07-16 DIAGNOSIS — E7800 Pure hypercholesterolemia, unspecified: Secondary | ICD-10-CM

## 2022-07-16 LAB — LIPID PANEL
Cholesterol: 256 mg/dL — ABNORMAL HIGH (ref 0–200)
HDL: 62.8 mg/dL (ref >39.00)
LDL Cholesterol: 173 mg/dL — ABNORMAL HIGH (ref 0–99)
NonHDL: 193.32
Total CHOL/HDL Ratio: 4
Triglycerides: 101 mg/dL (ref 0.0–149.0)
VLDL: 20.2 mg/dL (ref 0.0–40.0)

## 2022-07-16 LAB — COMPREHENSIVE METABOLIC PANEL
ALT: 16 U/L (ref 0–35)
AST: 17 U/L (ref 0–37)
Albumin: 4.2 g/dL (ref 3.5–5.2)
Alkaline Phosphatase: 90 U/L (ref 39–117)
BUN: 9 mg/dL (ref 6–23)
CO2: 28 mEq/L (ref 19–32)
Calcium: 10 mg/dL (ref 8.4–10.5)
Chloride: 104 mEq/L (ref 96–112)
Creatinine, Ser: 0.78 mg/dL (ref 0.40–1.20)
GFR: 83.41 mL/min (ref >60.00)
Glucose, Bld: 83 mg/dL (ref 70–99)
Potassium: 4.1 mEq/L (ref 3.5–5.1)
Sodium: 140 mEq/L (ref 135–145)
Total Bilirubin: 0.5 mg/dL (ref 0.2–1.2)
Total Protein: 6.8 g/dL (ref 6.0–8.3)

## 2022-07-16 LAB — CBC
HCT: 43.6 % (ref 36.0–46.0)
Hemoglobin: 14.9 g/dL (ref 12.0–15.0)
MCHC: 34.3 g/dL (ref 30.0–36.0)
MCV: 90.6 fl (ref 78.0–100.0)
Platelets: 239 10*3/uL (ref 150.0–400.0)
RBC: 4.81 Mil/uL (ref 3.87–5.11)
RDW: 12.6 % (ref 11.5–15.5)
WBC: 4.7 10*3/uL (ref 4.0–10.5)

## 2022-07-16 MED ORDER — ATORVASTATIN CALCIUM 10 MG PO TABS: 10.0000 mg | ORAL_TABLET | Freq: Every day | ORAL | 3 refills | Status: DC

## 2022-07-16 MED ORDER — SERTRALINE HCL 100 MG PO TABS: 100.0000 mg | ORAL_TABLET | Freq: Every day | ORAL | 4 refills | Status: DC

## 2022-07-16 NOTE — Progress Notes (Addendum)
Established Patient Office Visit   Subjective:  Patient ID: Natalie Drake, female    DOB: 18-May-1965  Age: 57 y.o. MRN: [id number]  Chief Complaint  Patient presents with   Annual Exam    CPE, no concerns. Patient fasting.     HPI Encounter Diagnoses  Name Primary?   Healthcare maintenance Yes   Depression, recurrent ([place])    Elevated LDL cholesterol level    Tobacco use    For follow-up of above.  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  Sertraline can continues to help her depression and anxiety.  She does have regular dental care.  She has regular GYN care.  She is not exercising other than at work where she does have to do a lot of walking.  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  She feels as though she has had memory issues since she took her COVID-vaccine.  Forgets things that she intends to do but admits that she is distracted.   Review of Systems  Constitutional: Negative.   HENT: Negative.    Eyes:  Negative for blurred vision, discharge and redness.  Respiratory: Negative.    Cardiovascular: Negative.   Gastrointestinal:  Negative for abdominal pain.  Genitourinary: Negative.   Musculoskeletal: Negative.  Negative for myalgias.  Skin:  Negative for rash.  Neurological:  Negative for tingling, loss of consciousness and weakness.  Endo/Heme/Allergies:  Negative for polydipsia.      07/16/2022   10:01 AM 07/16/2022    9:15 AM 04/16/2021    2:51 PM  Depression screen PHQ 2/9  Decreased Interest 1 0 1  Down, Depressed, Hopeless 1 0 2  PHQ - 2 Score 2 0 3  Altered sleeping 2  0  Tired, decreased energy 3  2  Change in appetite 1  0  Feeling bad or failure about yourself  0  0  Trouble concentrating 3  2  Moving slowly or fidgety/restless 0  0  Suicidal thoughts 0  0  PHQ-9 Score 11  7  Difficult doing work/chores Not difficult at all  Somewhat difficult       Current Outpatient  Medications:    atorvastatin (LIPITOR) 10 MG tablet, Take 1 tablet (10 mg total) by mouth daily., Disp: 90 tablet, Rfl: 3   diphenhydrAMINE HCl (BENADRYL ALLERGY PO), Take by mouth., Disp: , Rfl:    sertraline (ZOLOFT) 100 MG tablet, Take 1 tablet (100 mg total) by mouth daily., Disp: 90 tablet, Rfl: 4   Objective:     BP 108/66 (BP Location: Left Arm, Patient Position: Sitting, Cuff Size: Normal)   Pulse 82   Temp 98.2 F (36.8 C) (Temporal)   Ht '5\' 3"'$  (1.6 m)   Wt 131 lb 9.6 oz (59.7 kg)   SpO2 99%   BMI 23.31 kg/m    Physical Exam Constitutional:      General: She is not in acute distress.    Appearance: Normal appearance. She is not ill-appearing, toxic-appearing or diaphoretic.  HENT:     Head: Normocephalic and atraumatic.     Right Ear: Ear canal and external ear normal.     Left Ear: Tympanic membrane, ear canal and external ear normal.     Ears:     Comments: Cerumen plug in the right ear occluding the TM.    Mouth/Throat:     Mouth: Mucous membranes are moist.  Pharynx: Oropharynx is clear. No oropharyngeal exudate or posterior oropharyngeal erythema.  Eyes:     General: No scleral icterus.       Right eye: No discharge.        Left eye: No discharge.     Extraocular Movements: Extraocular movements intact.     Conjunctiva/sclera: Conjunctivae normal.     Pupils: Pupils are equal, round, and reactive to light.  Cardiovascular:     Rate and Rhythm: Normal rate and regular rhythm.  Pulmonary:     Effort: Pulmonary effort is normal. No respiratory distress.     Breath sounds: Normal breath sounds.  Abdominal:     General: Bowel sounds are normal.  Musculoskeletal:     Cervical back: No rigidity or tenderness.  Lymphadenopathy:     Cervical: No cervical adenopathy.  Skin:    General: Skin is warm and dry.  Neurological:     Mental Status: She is alert and oriented to person, place, and time.  Psychiatric:        Mood and Affect: Mood normal.         Behavior: Behavior normal.      Results for orders placed or performed in visit on 07/16/22  CBC  Result Value Ref Range   WBC 4.7 4.0 - 10.5 K/uL   RBC 4.81 3.87 - 5.11 Mil/uL   Platelets 239.0 150.0 - 400.0 K/uL   Hemoglobin 14.9 12.0 - 15.0 g/dL   HCT 43.6 36.0 - 46.0 %   MCV 90.6 78.0 - 100.0 fl   MCHC 34.3 30.0 - 36.0 g/dL   RDW 12.6 11.5 - 15.5 %  Comprehensive metabolic panel  Result Value Ref Range   Sodium 140 135 - 145 mEq/L   Potassium 4.1 3.5 - 5.1 mEq/L   Chloride 104 96 - 112 mEq/L   CO2 28 19 - 32 mEq/L   Glucose, Bld 83 70 - 99 mg/dL   BUN 9 6 - 23 mg/dL   Creatinine, Ser 0.78 0.40 - 1.20 mg/dL   Total Bilirubin 0.5 0.2 - 1.2 mg/dL   Alkaline Phosphatase 90 39 - 117 U/L   AST 17 0 - 37 U/L   ALT 16 0 - 35 U/L   Total Protein 6.8 6.0 - 8.3 g/dL   Albumin 4.2 3.5 - 5.2 g/dL   GFR 83.41 >60.00 mL/min   Calcium 10.0 8.4 - 10.5 mg/dL  Lipid panel  Result Value Ref Range   Cholesterol 256 (H) 0 - 200 mg/dL   Triglycerides 101.0 0.0 - 149.0 mg/dL   HDL 62.80 >39.00 mg/dL   VLDL 20.2 0.0 - 40.0 mg/dL   LDL Cholesterol 173 (H) 0 - 99 mg/dL   Total CHOL/HDL Ratio 4    NonHDL 193.32       The 10-year ASCVD risk score (Arnett DK, et al., 2019) is: 4.8%    Assessment & Plan:   Healthcare maintenance -     CBC -     Urinalysis, Routine w reflex microscopic  Depression, recurrent (HCC) -     Sertraline HCl; Take 1 tablet (100 mg total) by mouth daily.  Dispense: 90 tablet; Refill: 4  Elevated LDL cholesterol level -     Comprehensive metabolic panel -     Lipid panel -     Atorvastatin Calcium; Take 1 tablet (10 mg total) by mouth daily.  Dispense: 90 tablet; Refill: 3  Tobacco use    Return in about 6 months (around 01/16/2023).  Asked  her to return in 6 months but historically I see her yearly.  Discussed augmentation with sertraline but she is reluctant at this time to go forward with that.  Encouraged her to stop smoking.  Encouraged her to  consider having a screening CT of the chest.  She will let me know.  Continue follow-up with GYN.  Encouraged her to restart recommended vaccinations that her memory issues after her COVID-19 vaccine are likely not associated with the vaccine.  Information was given on managing depression and preventing high cholesterol.  Agrees to try a statin as long as there are no side effects.  Asked her to let me know if she does have problems with the statin and we can change it over the phone.  Information was given on managing the challenges of quitting smoking.  Also given information health maintenance and disease prevention.  She will follow-up with ENT for hearing check.  Libby Maw, MD

## 2022-07-16 NOTE — Addendum Note (Signed)
Addended by: Jon Billings on: 07/16/2022 03:01 PM   Modules accepted: Orders

## 2023-07-07 ENCOUNTER — Other Ambulatory Visit: Payer: Self-pay | Admitting: Family Medicine

## 2023-07-07 DIAGNOSIS — E7800 Pure hypercholesterolemia, unspecified: Secondary | ICD-10-CM

## 2023-07-11 MED ORDER — ATORVASTATIN CALCIUM 10 MG PO TABS: 10.0000 mg | ORAL_TABLET | Freq: Every day | ORAL | 0 refills | Status: DC

## 2023-07-11 NOTE — Telephone Encounter (Signed)
 LVM for pt that 5 day supply of atorvastatin was sent to pharmacy to get her through until her appt with PCP on 07/16/23.

## 2023-07-11 NOTE — Telephone Encounter (Signed)
 Copied from CRM [id number]. Topic: Clinical - Prescription Issue >> Jul 11, 2023  9:13 AM Adele Barthel wrote: Reason for CRM:   Patient is calling in regarding a refill request for her atorvastatin (LIPITOR) 10 MG tablet. Pharmacy is saying refill request was refused. Has appointment scheduled with provider on 07/18/2023. Advised she has 3 remaining refills at pharmacy. Requests call back because is concerned with missing a few days of her medication.   CB# [phone]

## 2023-07-18 ENCOUNTER — Ambulatory Visit (INDEPENDENT_AMBULATORY_CARE_PROVIDER_SITE_OTHER): Payer: 59 | Admitting: Family Medicine

## 2023-07-18 ENCOUNTER — Encounter: Payer: Self-pay | Admitting: Family Medicine

## 2023-07-18 VITALS — BP 102/66 | HR 85 | Temp 97.3°F | Ht 63.0 in | Wt 128.6 lb

## 2023-07-18 DIAGNOSIS — Z131 Encounter for screening for diabetes mellitus: Secondary | ICD-10-CM | POA: Diagnosis not present

## 2023-07-18 DIAGNOSIS — Z720 Tobacco use: Secondary | ICD-10-CM

## 2023-07-18 DIAGNOSIS — E7800 Pure hypercholesterolemia, unspecified: Secondary | ICD-10-CM | POA: Diagnosis not present

## 2023-07-18 DIAGNOSIS — Z0000 Encounter for general adult medical examination without abnormal findings: Secondary | ICD-10-CM

## 2023-07-18 DIAGNOSIS — F339 Major depressive disorder, recurrent, unspecified: Secondary | ICD-10-CM

## 2023-07-18 LAB — COMPREHENSIVE METABOLIC PANEL
ALT: 16 U/L (ref 0–35)
AST: 19 U/L (ref 0–37)
Albumin: 4.6 g/dL (ref 3.5–5.2)
Alkaline Phosphatase: 99 U/L (ref 39–117)
BUN: 15 mg/dL (ref 6–23)
CO2: 29 meq/L (ref 19–32)
Calcium: 9.7 mg/dL (ref 8.4–10.5)
Chloride: 104 meq/L (ref 96–112)
Creatinine, Ser: 0.65 mg/dL (ref 0.40–1.20)
GFR: 96.01 mL/min (ref >60.00)
Glucose, Bld: 89 mg/dL (ref 70–99)
Potassium: 4.3 meq/L (ref 3.5–5.1)
Sodium: 141 meq/L (ref 135–145)
Total Bilirubin: 0.6 mg/dL (ref 0.2–1.2)
Total Protein: 7.1 g/dL (ref 6.0–8.3)

## 2023-07-18 LAB — URINALYSIS, ROUTINE W REFLEX MICROSCOPIC
Bilirubin Urine: NEGATIVE
Ketones, ur: NEGATIVE
Nitrite: NEGATIVE
Specific Gravity, Urine: 1.015 (ref 1.000–1.030)
Total Protein, Urine: NEGATIVE
Urine Glucose: NEGATIVE
Urobilinogen, UA: 0.2 (ref 0.0–1.0)
pH: 6 (ref 5.0–8.0)

## 2023-07-18 LAB — CBC WITH DIFFERENTIAL/PLATELET
Basophils Absolute: 0 10*3/uL (ref 0.0–0.1)
Basophils Relative: 0.9 % (ref 0.0–3.0)
Eosinophils Absolute: 0.2 10*3/uL (ref 0.0–0.7)
Eosinophils Relative: 4.7 % (ref 0.0–5.0)
HCT: 43.7 % (ref 36.0–46.0)
Hemoglobin: 14.8 g/dL (ref 12.0–15.0)
Lymphocytes Relative: 32.8 % (ref 12.0–46.0)
Lymphs Abs: 1.4 10*3/uL (ref 0.7–4.0)
MCHC: 33.8 g/dL (ref 30.0–36.0)
MCV: 94.2 fl (ref 78.0–100.0)
Monocytes Absolute: 0.2 10*3/uL (ref 0.1–1.0)
Monocytes Relative: 5.1 % (ref 3.0–12.0)
Neutro Abs: 2.4 10*3/uL (ref 1.4–7.7)
Neutrophils Relative %: 56.5 % (ref 43.0–77.0)
Platelets: 220 10*3/uL (ref 150.0–400.0)
RBC: 4.64 Mil/uL (ref 3.87–5.11)
RDW: 12.4 % (ref 11.5–15.5)
WBC: 4.3 10*3/uL (ref 4.0–10.5)

## 2023-07-18 LAB — TSH: TSH: 1.15 u[IU]/mL (ref 0.35–5.50)

## 2023-07-18 LAB — LIPID PANEL
Cholesterol: 175 mg/dL (ref 0–200)
HDL: 70.8 mg/dL (ref >39.00)
LDL Cholesterol: 91 mg/dL (ref 0–99)
NonHDL: 104.17
Total CHOL/HDL Ratio: 2
Triglycerides: 67 mg/dL (ref 0.0–149.0)
VLDL: 13.4 mg/dL (ref 0.0–40.0)

## 2023-07-18 LAB — HEMOGLOBIN A1C: Hgb A1c MFr Bld: 5.6 % (ref 4.6–6.5)

## 2023-07-18 NOTE — Progress Notes (Signed)
 Established Patient Office Visit   Subjective:  Patient ID: Natalie Drake, female    DOB: [birth year]  Age: 58 y.o. MRN: [id number]  Chief Complaint  Patient presents with   Annual Exam    CPE. Pt is fasting. Pt refused Care Gaps (mam. Pap. Immunization, colonoscopy)    HPI Encounter Diagnoses  Name Primary?   Healthcare maintenance Yes   Screening for diabetes mellitus    Depression, recurrent (HCC)    Elevated LDL cholesterol level    Tobacco use    Here for physical and follow-up for above.  Continues with her stressful job at [employer].  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  She is exercising some and continues to see the dentist regularly.  She has cut back on her smoking.  She is overdue for a great deal of health maintenance but is simply not interested in pursuing screening exams.  Refusing vaccines.  Continues to find sertraline quite helpful for her mood and anxiety.  Continues with atorvastatin 10 mg daily without issue.   Review of Systems  Constitutional: Negative.   HENT: Negative.    Eyes:  Negative for blurred vision, discharge and redness.  Respiratory: Negative.    Cardiovascular: Negative.   Gastrointestinal:  Negative for abdominal pain.  Genitourinary: Negative.   Musculoskeletal: Negative.  Negative for myalgias.  Skin:  Negative for rash.  Neurological:  Negative for tingling, loss of consciousness and weakness.  Endo/Heme/Allergies:  Negative for polydipsia.      07/18/2023    8:59 AM 07/16/2022   10:01 AM 07/16/2022    9:15 AM  Depression screen PHQ 2/9  Decreased Interest 1 1 0  Down, Depressed, Hopeless 2 1 0  PHQ - 2 Score 3 2 0  Altered sleeping 0 2   Tired, decreased energy 2 3   Change in appetite 1 1   Feeling bad or failure about yourself  0 0   Trouble concentrating 2 3   Moving slowly or fidgety/restless 0 0   Suicidal thoughts 0 0   PHQ-9 Score 8 11   Difficult doing work/chores Somewhat difficult Not difficult at  all        Current Outpatient Medications:    Apoaequorin (PREVAGEN PO), Take 1 mg by mouth daily., Disp: , Rfl:    atorvastatin (LIPITOR) 10 MG tablet, Take 1 tablet (10 mg total) by mouth daily., Disp: 5 tablet, Rfl: 0   diphenhydrAMINE HCl (BENADRYL ALLERGY PO), Take by mouth., Disp: , Rfl:    sertraline (ZOLOFT) 100 MG tablet, Take 1 tablet (100 mg total) by mouth daily., Disp: 90 tablet, Rfl: 4   Objective:     BP 102/66   Pulse 85   Temp (!) 97.3 F (36.3 C)   Ht 5\' 3"  (1.6 m)   Wt 128 lb 9.6 oz (58.3 kg)   SpO2 99%   BMI 22.78 kg/m    Physical Exam Constitutional:      General: She is not in acute distress.    Appearance: Normal appearance. She is not ill-appearing, toxic-appearing or diaphoretic.  HENT:     Head: Normocephalic and atraumatic.     Right Ear: External ear normal.     Left Ear: External ear normal.     Mouth/Throat:     Mouth: Mucous membranes are moist.     Pharynx: Oropharynx is clear. No oropharyngeal exudate or posterior oropharyngeal erythema.  Eyes:     General: No scleral icterus.  Right eye: No discharge.        Left eye: No discharge.     Extraocular Movements: Extraocular movements intact.     Conjunctiva/sclera: Conjunctivae normal.     Pupils: Pupils are equal, round, and reactive to light.  Cardiovascular:     Rate and Rhythm: Normal rate and regular rhythm.  Pulmonary:     Effort: Pulmonary effort is normal. No respiratory distress.     Breath sounds: Normal breath sounds. No wheezing, rhonchi or rales.  Abdominal:     General: Bowel sounds are normal.  Musculoskeletal:     Cervical back: No rigidity or tenderness.  Skin:    General: Skin is warm and dry.  Neurological:     Mental Status: She is alert and oriented to person, place, and time.  Psychiatric:        Mood and Affect: Mood normal.        Behavior: Behavior normal.      No results found for any visits on 07/18/23.    The 10-year ASCVD risk score  (Arnett DK, et al., 2019) is: 4.6%    Assessment & Plan:   Healthcare maintenance -     CBC with Differential/Platelet -     Urinalysis, Routine w reflex microscopic  Screening for diabetes mellitus -     Comprehensive metabolic panel -     Hemoglobin A1c  Depression, recurrent (HCC) -     TSH  Elevated LDL cholesterol level -     Comprehensive metabolic panel -     Lipid panel  Tobacco use    No follow-ups on file.  Patient is due for Pap smear, colonoscopy, mammogram and possibly low-dose CT screening chest.  She is simply not interested in any health maintenance.  We had a long discussion all of the screening test can pick up problems and early stage.  Encouraged her to continue tobacco cessation efforts.  Suggested adding Wellbutrin for further mood elevation and assistance with tobacco cessation.  She declines for now.  Information given on managing tobacco cessation.  Information given on health maintenance and disease prevention.  Information given on managing cholesterol.  May need to adjust dose of atorvastatin.   Mliss Sax, MD

## 2023-07-22 ENCOUNTER — Other Ambulatory Visit: Payer: Self-pay | Admitting: Family Medicine

## 2023-07-22 DIAGNOSIS — E7800 Pure hypercholesterolemia, unspecified: Secondary | ICD-10-CM

## 2023-07-22 MED ORDER — ATORVASTATIN CALCIUM 10 MG PO TABS
10.0000 mg | ORAL_TABLET | Freq: Every day | ORAL | 5 refills | Status: DC
Start: 2023-07-22 — End: 2023-11-10

## 2023-07-22 NOTE — Telephone Encounter (Signed)
 Copied from CRM [id number]. Topic: Clinical - Medication Refill >> Jul 22, 2023 10:19 AM Shelbie Proctor wrote: Most Recent Primary Care Visit:  Provider: Mliss Sax  Department: [facility]  Visit Type: PHYSICAL  Date: 07/18/2023  Medication: atorvastatin (LIPITOR) 10 MG table  Has the patient contacted their pharmacy? No (Agent: If no, request that the patient contact the pharmacy for the refill. If patient does not wish to contact the pharmacy document the reason why and proceed with request.) (Agent: If yes, when and what did the pharmacy advise?)  Is this the correct pharmacy for this prescription? Yes If no, delete pharmacy and type the correct one.  This is the patient's preferred pharmacy:  [id number] - [place], [place] - [address] [place] [address] Phone: [phone] Fax: [phone]   Has the prescription been filled recently? No  Is the patient out of the medication? Yes. Patient wants to know is it dangerous to miss her cholesterol medication? Patient wants refill done today.   Has the patient been seen for an appointment in the last year OR does the patient have an upcoming appointment? Yes  Can we respond through MyChart? No, call [phone]  Agent: Please be advised that Rx refills may take up to 3 business days. We ask that you follow-up with your pharmacy.

## 2023-07-22 NOTE — Telephone Encounter (Signed)
 Copied from CRM [id number]. Topic: Clinical - Lab/Test Results >> Jul 22, 2023 10:17 AM Orinda Kenner C wrote: Reason for CRM: Patient [phone] is out of atorvastatin (LIPITOR) 10 MG tablet and needs test results. Informed patient labs results 07/18/23. Patient verbalized understanding Patient does not want screening exams. >> Jul 22, 2023 10:19 AM Orinda Kenner C wrote: Lorain Childes, results have been relayed and patient has no further questions on test results

## 2023-10-03 ENCOUNTER — Other Ambulatory Visit: Payer: Self-pay | Admitting: Family Medicine

## 2023-10-03 DIAGNOSIS — F339 Major depressive disorder, recurrent, unspecified: Secondary | ICD-10-CM

## 2023-11-10 ENCOUNTER — Other Ambulatory Visit: Payer: Self-pay | Admitting: Family Medicine

## 2023-11-10 DIAGNOSIS — E7800 Pure hypercholesterolemia, unspecified: Secondary | ICD-10-CM

## 2024-02-06 ENCOUNTER — Other Ambulatory Visit: Payer: Self-pay | Admitting: Family Medicine

## 2024-02-06 DIAGNOSIS — E7800 Pure hypercholesterolemia, unspecified: Secondary | ICD-10-CM
